# Patient Record
Sex: Female | Born: 1950 | Race: White | Hispanic: No | Marital: Married | Smoking: Former smoker
Health system: Southern US, Community
[De-identification: ages and names within clinical notes are randomized; demographics above are authoritative.]

## PROBLEM LIST (undated history)

## (undated) DIAGNOSIS — M1990 Unspecified osteoarthritis, unspecified site: Secondary | ICD-10-CM

## (undated) DIAGNOSIS — I10 Essential (primary) hypertension: Secondary | ICD-10-CM

## (undated) DIAGNOSIS — R51 Headache: Secondary | ICD-10-CM

## (undated) DIAGNOSIS — Z9889 Other specified postprocedural states: Secondary | ICD-10-CM

## (undated) DIAGNOSIS — K589 Irritable bowel syndrome without diarrhea: Secondary | ICD-10-CM

## (undated) DIAGNOSIS — K5909 Other constipation: Secondary | ICD-10-CM

## (undated) DIAGNOSIS — R112 Nausea with vomiting, unspecified: Secondary | ICD-10-CM

## (undated) HISTORY — DX: Other constipation: K59.09

## (undated) HISTORY — PX: TOTAL HIP ARTHROPLASTY: SHX124

## (undated) HISTORY — PX: COLONOSCOPY: SHX174

## (undated) HISTORY — DX: Essential (primary) hypertension: I10

## (undated) HISTORY — DX: Irritable bowel syndrome, unspecified: K58.9

## (undated) HISTORY — DX: Unspecified osteoarthritis, unspecified site: M19.90

## (undated) HISTORY — PX: OTHER SURGICAL HISTORY: SHX169

---

## 1984-01-01 HISTORY — PX: TUBAL LIGATION: SHX77

## 1984-01-01 HISTORY — PX: ECTOPIC PREGNANCY SURGERY: SHX613

## 2003-11-19 ENCOUNTER — Encounter: Admission: RE | Admit: 2003-11-19 | Discharge: 2003-11-19 | Payer: Self-pay | Admitting: Internal Medicine

## 2004-01-10 ENCOUNTER — Other Ambulatory Visit: Admission: RE | Admit: 2004-01-10 | Discharge: 2004-01-10 | Payer: Self-pay | Admitting: Obstetrics and Gynecology

## 2004-04-21 ENCOUNTER — Encounter: Admission: RE | Admit: 2004-04-21 | Discharge: 2004-04-21 | Payer: Self-pay | Admitting: Obstetrics and Gynecology

## 2004-05-17 ENCOUNTER — Encounter: Payer: Self-pay | Admitting: Internal Medicine

## 2005-01-29 ENCOUNTER — Encounter: Admission: RE | Admit: 2005-01-29 | Discharge: 2005-01-29 | Payer: Self-pay | Admitting: Obstetrics and Gynecology

## 2005-06-01 ENCOUNTER — Ambulatory Visit: Payer: Self-pay | Admitting: Internal Medicine

## 2005-06-11 ENCOUNTER — Ambulatory Visit: Payer: Self-pay | Admitting: Internal Medicine

## 2005-06-12 ENCOUNTER — Encounter: Admission: RE | Admit: 2005-06-12 | Discharge: 2005-06-12 | Payer: Self-pay | Admitting: Internal Medicine

## 2005-06-14 ENCOUNTER — Ambulatory Visit: Payer: Self-pay | Admitting: Internal Medicine

## 2005-06-15 ENCOUNTER — Encounter: Payer: Self-pay | Admitting: Internal Medicine

## 2005-06-29 ENCOUNTER — Ambulatory Visit: Payer: Self-pay | Admitting: Internal Medicine

## 2005-12-07 ENCOUNTER — Ambulatory Visit: Payer: Self-pay | Admitting: Internal Medicine

## 2005-12-17 ENCOUNTER — Ambulatory Visit: Payer: Self-pay | Admitting: Internal Medicine

## 2006-01-25 ENCOUNTER — Encounter: Admission: RE | Admit: 2006-01-25 | Discharge: 2006-01-25 | Payer: Self-pay | Admitting: Internal Medicine

## 2006-01-29 ENCOUNTER — Ambulatory Visit: Payer: Self-pay | Admitting: Internal Medicine

## 2006-02-05 ENCOUNTER — Encounter: Admission: RE | Admit: 2006-02-05 | Discharge: 2006-04-09 | Payer: Self-pay | Admitting: Internal Medicine

## 2006-02-07 ENCOUNTER — Encounter: Admission: RE | Admit: 2006-02-07 | Discharge: 2006-02-07 | Payer: Self-pay | Admitting: Obstetrics and Gynecology

## 2006-05-06 ENCOUNTER — Ambulatory Visit: Payer: Self-pay | Admitting: Internal Medicine

## 2006-08-08 ENCOUNTER — Encounter: Admission: RE | Admit: 2006-08-08 | Discharge: 2006-08-08 | Payer: Self-pay | Admitting: Orthopedic Surgery

## 2006-11-15 ENCOUNTER — Encounter: Admission: RE | Admit: 2006-11-15 | Discharge: 2006-11-15 | Payer: Self-pay | Admitting: Orthopedic Surgery

## 2006-12-03 ENCOUNTER — Ambulatory Visit: Payer: Self-pay | Admitting: Internal Medicine

## 2006-12-03 LAB — CONVERTED CEMR LAB
Free T4: 1.1 ng/dL (ref 0.9–1.8)
T3, Free: 2.6 pg/mL (ref 2.3–4.2)
TSH: 0.86 microintl units/mL (ref 0.35–5.50)

## 2006-12-12 ENCOUNTER — Ambulatory Visit: Payer: Self-pay | Admitting: Internal Medicine

## 2007-02-24 ENCOUNTER — Encounter: Admission: RE | Admit: 2007-02-24 | Discharge: 2007-02-24 | Payer: Self-pay | Admitting: Obstetrics and Gynecology

## 2007-03-21 ENCOUNTER — Encounter: Admission: RE | Admit: 2007-03-21 | Discharge: 2007-03-21 | Payer: Self-pay | Admitting: Orthopedic Surgery

## 2007-04-03 ENCOUNTER — Encounter: Admission: RE | Admit: 2007-04-03 | Discharge: 2007-04-03 | Payer: Self-pay | Admitting: Orthopedic Surgery

## 2007-05-23 ENCOUNTER — Ambulatory Visit: Payer: Self-pay | Admitting: Internal Medicine

## 2007-05-23 ENCOUNTER — Encounter: Payer: Self-pay | Admitting: Internal Medicine

## 2007-05-23 LAB — CONVERTED CEMR LAB
Cholesterol: 191 mg/dL (ref 0–200)
Glucose, Bld: 91 mg/dL (ref 70–99)
HDL: 51.7 mg/dL (ref >39.0)
Triglycerides: 70 mg/dL (ref 0–149)

## 2007-06-05 ENCOUNTER — Telehealth: Payer: Self-pay | Admitting: Internal Medicine

## 2007-06-18 ENCOUNTER — Ambulatory Visit: Payer: Self-pay | Admitting: Internal Medicine

## 2007-06-23 ENCOUNTER — Inpatient Hospital Stay (HOSPITAL_COMMUNITY): Admission: RE | Admit: 2007-06-23 | Discharge: 2007-06-26 | Payer: Self-pay | Admitting: Orthopedic Surgery

## 2007-07-08 ENCOUNTER — Telehealth (INDEPENDENT_AMBULATORY_CARE_PROVIDER_SITE_OTHER): Payer: Self-pay | Admitting: *Deleted

## 2007-10-16 ENCOUNTER — Ambulatory Visit: Payer: Self-pay | Admitting: Internal Medicine

## 2007-10-21 ENCOUNTER — Encounter (INDEPENDENT_AMBULATORY_CARE_PROVIDER_SITE_OTHER): Payer: Self-pay | Admitting: *Deleted

## 2007-10-22 ENCOUNTER — Ambulatory Visit: Payer: Self-pay | Admitting: Internal Medicine

## 2007-10-22 DIAGNOSIS — E559 Vitamin D deficiency, unspecified: Secondary | ICD-10-CM | POA: Insufficient documentation

## 2007-10-22 DIAGNOSIS — I10 Essential (primary) hypertension: Secondary | ICD-10-CM | POA: Insufficient documentation

## 2007-11-03 ENCOUNTER — Inpatient Hospital Stay (HOSPITAL_COMMUNITY): Admission: RE | Admit: 2007-11-03 | Discharge: 2007-11-06 | Payer: Self-pay | Admitting: Orthopedic Surgery

## 2007-11-12 IMAGING — CR DG PORTABLE PELVIS
1 series · 1 of 1 positions shown · non-contrast
Comparison: 06/23/2007

CLINICAL DATA: Post-op right hip. 
 PORTABLE PELVIS - 1 VIEW AT 7995 HOURS:

[view not recorded]
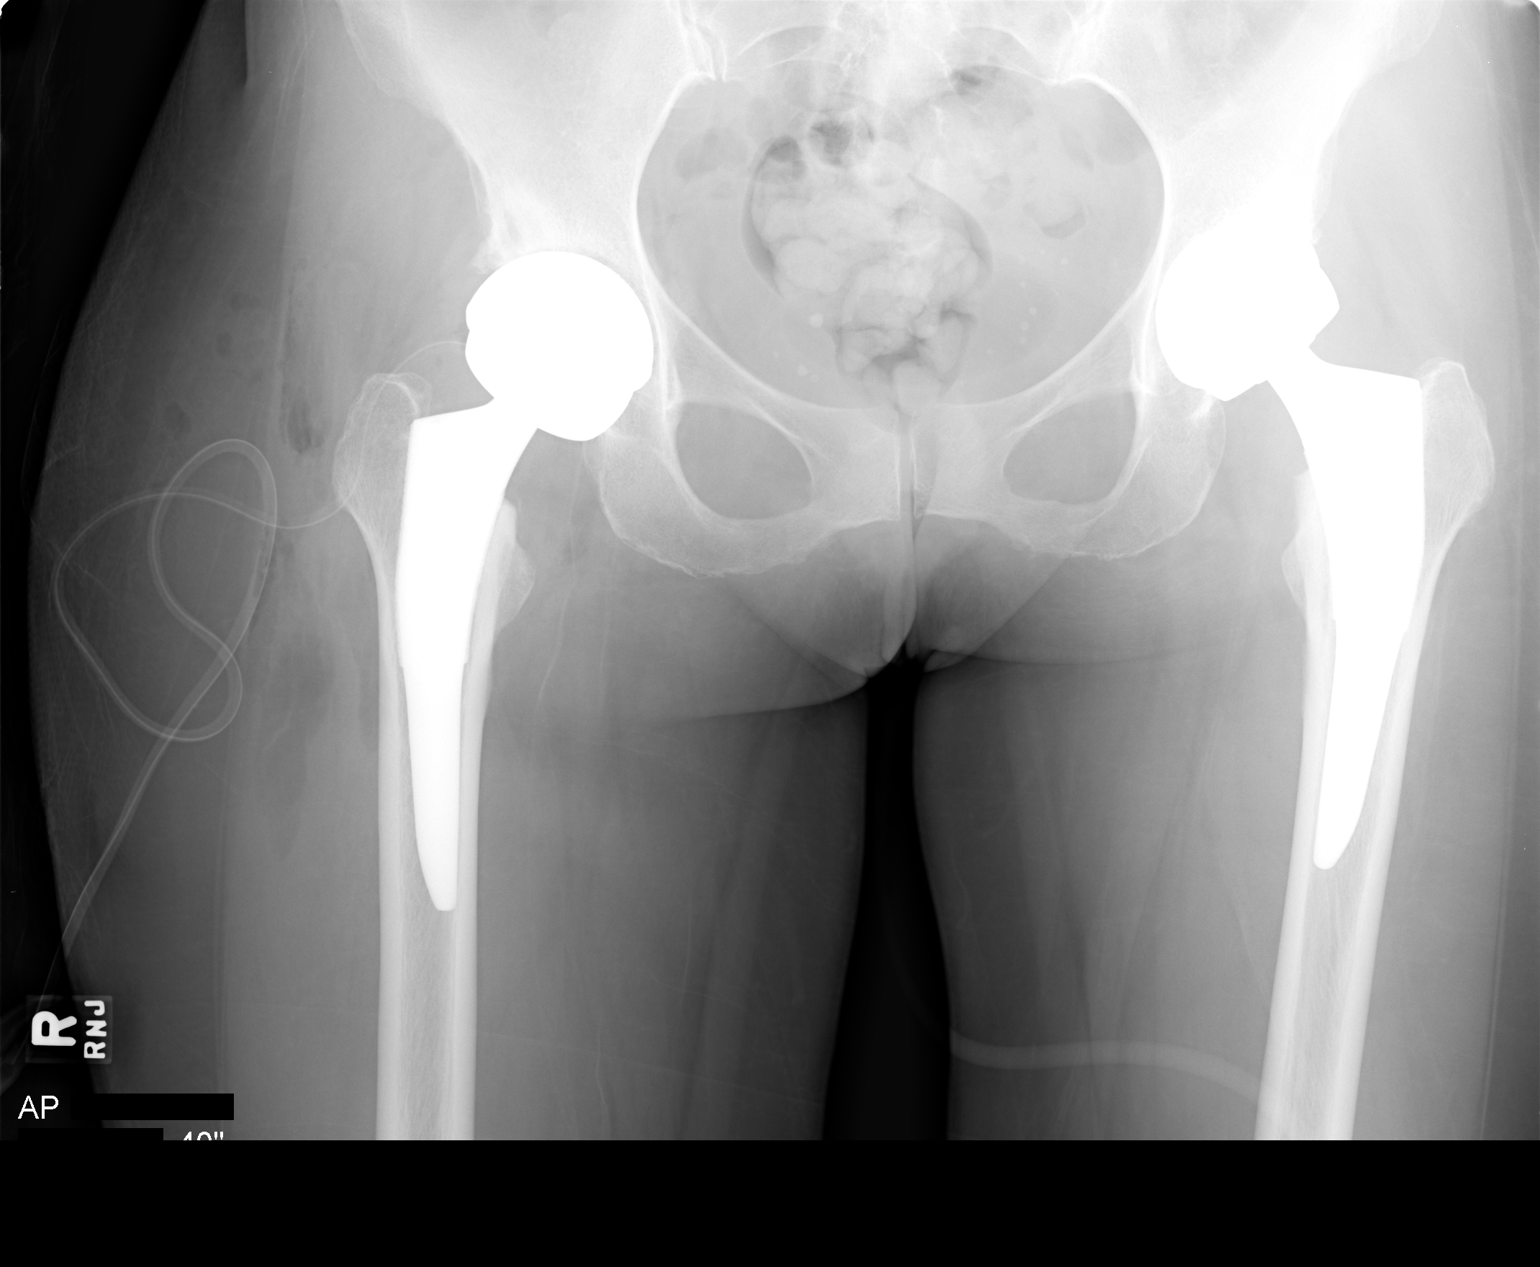

[1 of 1 positions shown; findings below may reference images not displayed]

FINDINGS: Patient status post right hip arthroplasty.  Right acetabular and femoral prosthetic components appear in satisfactory position and alignment on this single view. Radiopaque drain is noted.  Pre-existing left THR is noted.
IMPRESSION: Satisfactory appearance of right THR.

## 2008-02-27 ENCOUNTER — Encounter: Admission: RE | Admit: 2008-02-27 | Discharge: 2008-02-27 | Payer: Self-pay | Admitting: Obstetrics

## 2008-04-29 ENCOUNTER — Encounter (INDEPENDENT_AMBULATORY_CARE_PROVIDER_SITE_OTHER): Payer: Self-pay | Admitting: *Deleted

## 2008-04-29 ENCOUNTER — Ambulatory Visit: Payer: Self-pay | Admitting: Internal Medicine

## 2008-05-10 ENCOUNTER — Encounter (INDEPENDENT_AMBULATORY_CARE_PROVIDER_SITE_OTHER): Payer: Self-pay | Admitting: *Deleted

## 2008-05-20 ENCOUNTER — Encounter: Payer: Self-pay | Admitting: Internal Medicine

## 2008-08-10 ENCOUNTER — Ambulatory Visit: Payer: Self-pay | Admitting: Internal Medicine

## 2008-09-08 ENCOUNTER — Encounter (INDEPENDENT_AMBULATORY_CARE_PROVIDER_SITE_OTHER): Payer: Self-pay | Admitting: *Deleted

## 2008-11-19 ENCOUNTER — Ambulatory Visit: Payer: Self-pay | Admitting: Family Medicine

## 2008-11-19 ENCOUNTER — Encounter: Payer: Self-pay | Admitting: Internal Medicine

## 2008-11-29 ENCOUNTER — Encounter (INDEPENDENT_AMBULATORY_CARE_PROVIDER_SITE_OTHER): Payer: Self-pay | Admitting: *Deleted

## 2009-02-28 ENCOUNTER — Encounter: Admission: RE | Admit: 2009-02-28 | Discharge: 2009-02-28 | Payer: Self-pay | Admitting: Obstetrics

## 2009-05-03 ENCOUNTER — Ambulatory Visit: Payer: Self-pay | Admitting: Internal Medicine

## 2009-05-03 DIAGNOSIS — M159 Polyosteoarthritis, unspecified: Secondary | ICD-10-CM | POA: Insufficient documentation

## 2009-05-03 DIAGNOSIS — N959 Unspecified menopausal and perimenopausal disorder: Secondary | ICD-10-CM | POA: Insufficient documentation

## 2009-05-04 ENCOUNTER — Encounter (INDEPENDENT_AMBULATORY_CARE_PROVIDER_SITE_OTHER): Payer: Self-pay | Admitting: *Deleted

## 2009-05-06 ENCOUNTER — Encounter (INDEPENDENT_AMBULATORY_CARE_PROVIDER_SITE_OTHER): Payer: Self-pay | Admitting: *Deleted

## 2009-05-18 ENCOUNTER — Ambulatory Visit: Payer: Self-pay | Admitting: Internal Medicine

## 2009-05-18 ENCOUNTER — Encounter: Payer: Self-pay | Admitting: Internal Medicine

## 2009-05-31 ENCOUNTER — Encounter (INDEPENDENT_AMBULATORY_CARE_PROVIDER_SITE_OTHER): Payer: Self-pay | Admitting: *Deleted

## 2009-12-01 ENCOUNTER — Telehealth: Payer: Self-pay | Admitting: Internal Medicine

## 2009-12-05 ENCOUNTER — Encounter: Admission: RE | Admit: 2009-12-05 | Discharge: 2009-12-05 | Payer: Self-pay | Admitting: Orthopedic Surgery

## 2009-12-06 ENCOUNTER — Ambulatory Visit: Payer: Self-pay | Admitting: Internal Medicine

## 2009-12-06 LAB — CONVERTED CEMR LAB: Vit D, 25-Hydroxy: 51 ng/mL (ref 30–89)

## 2009-12-10 LAB — CONVERTED CEMR LAB
ALT: 17 units/L (ref 0–35)
Creatinine, Ser: 0.9 mg/dL (ref 0.4–1.2)
Total Protein: 7.1 g/dL (ref 6.0–8.3)

## 2009-12-12 ENCOUNTER — Encounter (INDEPENDENT_AMBULATORY_CARE_PROVIDER_SITE_OTHER): Payer: Self-pay | Admitting: *Deleted

## 2009-12-31 HISTORY — PX: CHOLECYSTECTOMY: SHX55

## 2010-02-07 ENCOUNTER — Ambulatory Visit (HOSPITAL_COMMUNITY): Admission: RE | Admit: 2010-02-07 | Discharge: 2010-02-07 | Payer: Self-pay | Admitting: Orthopedic Surgery

## 2010-03-14 ENCOUNTER — Encounter: Admission: RE | Admit: 2010-03-14 | Discharge: 2010-03-14 | Payer: Self-pay | Admitting: Obstetrics

## 2010-06-19 ENCOUNTER — Ambulatory Visit: Payer: Self-pay | Admitting: Internal Medicine

## 2010-06-23 LAB — CONVERTED CEMR LAB: Vit D, 25-Hydroxy: 52 ng/mL (ref 30–89)

## 2010-07-13 ENCOUNTER — Ambulatory Visit: Payer: Self-pay | Admitting: Internal Medicine

## 2010-07-13 DIAGNOSIS — T887XXA Unspecified adverse effect of drug or medicament, initial encounter: Secondary | ICD-10-CM

## 2010-07-13 DIAGNOSIS — M25559 Pain in unspecified hip: Secondary | ICD-10-CM

## 2010-07-13 DIAGNOSIS — I080 Rheumatic disorders of both mitral and aortic valves: Secondary | ICD-10-CM

## 2010-07-17 ENCOUNTER — Telehealth (INDEPENDENT_AMBULATORY_CARE_PROVIDER_SITE_OTHER): Payer: Self-pay | Admitting: *Deleted

## 2010-08-01 ENCOUNTER — Inpatient Hospital Stay (HOSPITAL_COMMUNITY): Admission: RE | Admit: 2010-08-01 | Discharge: 2010-08-03 | Payer: Self-pay | Admitting: Orthopedic Surgery

## 2010-08-10 IMAGING — CR DG PORTABLE PELVIS
1 series · 1 of 1 positions shown · non-contrast
Comparison: 11/03/2007.

CLINICAL DATA: Failed total left hip.

PORTABLE PELVIS

[series [date]]
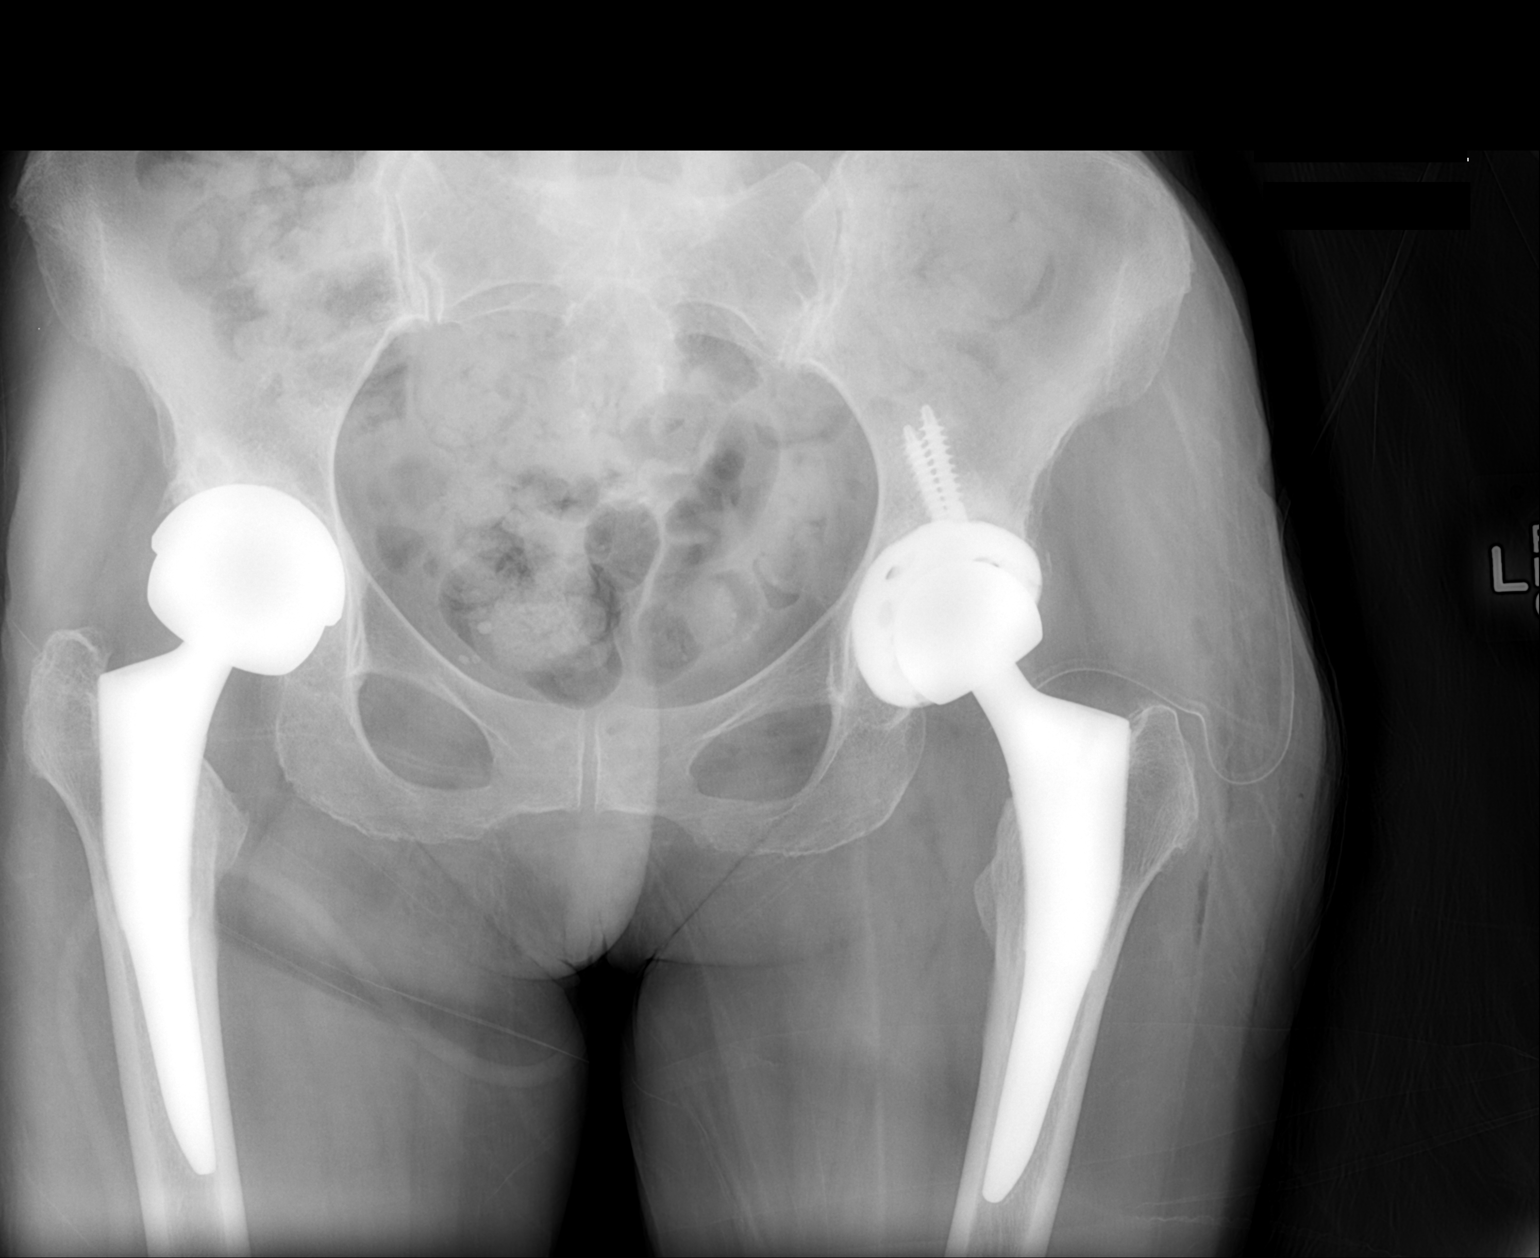

[1 of 1 positions shown; findings below may reference images not displayed]

FINDINGS: The left total hip arthroplasty appears well seated
without complicating features.
IMPRESSION: Well seated components of a total left hip arthroplasty without
complicating features.

## 2010-11-20 ENCOUNTER — Ambulatory Visit: Payer: Self-pay | Admitting: Internal Medicine

## 2010-11-20 DIAGNOSIS — R1013 Epigastric pain: Secondary | ICD-10-CM

## 2010-11-21 ENCOUNTER — Ambulatory Visit: Payer: Self-pay | Admitting: Internal Medicine

## 2010-11-21 ENCOUNTER — Telehealth: Payer: Self-pay | Admitting: Internal Medicine

## 2010-11-22 ENCOUNTER — Ambulatory Visit: Payer: Self-pay | Admitting: Internal Medicine

## 2010-11-22 ENCOUNTER — Encounter: Admission: RE | Admit: 2010-11-22 | Discharge: 2010-11-22 | Payer: Self-pay | Admitting: Internal Medicine

## 2010-11-22 DIAGNOSIS — K8020 Calculus of gallbladder without cholecystitis without obstruction: Secondary | ICD-10-CM | POA: Insufficient documentation

## 2010-11-22 DIAGNOSIS — R740 Nonspecific elevation of levels of transaminase and lactic acid dehydrogenase [LDH]: Secondary | ICD-10-CM

## 2010-11-22 DIAGNOSIS — D72819 Decreased white blood cell count, unspecified: Secondary | ICD-10-CM | POA: Insufficient documentation

## 2010-11-22 DIAGNOSIS — D7289 Other specified disorders of white blood cells: Secondary | ICD-10-CM

## 2010-11-22 LAB — CONVERTED CEMR LAB
AST: 167 units/L — ABNORMAL HIGH (ref 0–37)
Amylase: 106 units/L (ref 27–131)
Basophils Absolute: 0 10*3/uL (ref 0.0–0.1)
H Pylori IgG: NEGATIVE
HCT: 36.3 % (ref 36.0–46.0)
Lymphocytes Relative: 54.4 % — ABNORMAL HIGH (ref 12.0–46.0)
Lymphs Abs: 1.4 10*3/uL (ref 0.7–4.0)
Monocytes Relative: 11.4 % (ref 3.0–12.0)
Platelets: 223 10*3/uL (ref 150.0–400.0)
RDW: 15.7 % — ABNORMAL HIGH (ref 11.5–14.6)
Total Bilirubin: 0.7 mg/dL (ref 0.3–1.2)

## 2010-11-25 ENCOUNTER — Telehealth: Payer: Self-pay | Admitting: Internal Medicine

## 2010-11-27 ENCOUNTER — Ambulatory Visit: Payer: Self-pay | Admitting: Internal Medicine

## 2010-11-27 LAB — CONVERTED CEMR LAB
AST: 95 units/L — ABNORMAL HIGH (ref 0–37)
Bilirubin, Direct: 0.2 mg/dL (ref 0.0–0.3)
HCV Ab: NEGATIVE
Hep B C IgM: NEGATIVE
OCCULT 2: NEGATIVE
Total Bilirubin: 0.7 mg/dL (ref 0.3–1.2)

## 2010-11-28 ENCOUNTER — Encounter (INDEPENDENT_AMBULATORY_CARE_PROVIDER_SITE_OTHER): Payer: Self-pay | Admitting: *Deleted

## 2010-11-28 LAB — CONVERTED CEMR LAB
ALT: 107 units/L — ABNORMAL HIGH (ref 0–35)
AST: 31 units/L (ref 0–37)
Basophils Relative: 0.9 % (ref 0.0–3.0)
Bilirubin, Direct: 0.1 mg/dL (ref 0.0–0.3)
Eosinophils Relative: 2.3 % (ref 0.0–5.0)
HCT: 37.9 % (ref 36.0–46.0)
MCV: 91.5 fL (ref 78.0–100.0)
Monocytes Absolute: 0.3 10*3/uL (ref 0.1–1.0)
Monocytes Relative: 9.2 % (ref 3.0–12.0)
Neutrophils Relative %: 55.3 % (ref 43.0–77.0)
Platelets: 233 10*3/uL (ref 150.0–400.0)
RBC: 4.14 M/uL (ref 3.87–5.11)
Total Bilirubin: 0.4 mg/dL (ref 0.3–1.2)
Total Protein: 7.3 g/dL (ref 6.0–8.3)
WBC: 3.7 10*3/uL — ABNORMAL LOW (ref 4.5–10.5)

## 2010-12-08 ENCOUNTER — Ambulatory Visit: Payer: Self-pay | Admitting: Internal Medicine

## 2010-12-12 ENCOUNTER — Ambulatory Visit (HOSPITAL_COMMUNITY)
Admission: RE | Admit: 2010-12-12 | Discharge: 2010-12-12 | Payer: Self-pay | Source: Home / Self Care | Attending: Surgery | Admitting: Surgery

## 2010-12-15 ENCOUNTER — Telehealth (INDEPENDENT_AMBULATORY_CARE_PROVIDER_SITE_OTHER): Payer: Self-pay | Admitting: *Deleted

## 2010-12-20 LAB — CONVERTED CEMR LAB
Bilirubin, Direct: 0.1 mg/dL (ref 0.0–0.3)
Total Bilirubin: 0.7 mg/dL (ref 0.3–1.2)

## 2011-01-05 ENCOUNTER — Encounter: Payer: Self-pay | Admitting: Internal Medicine

## 2011-01-28 LAB — CONVERTED CEMR LAB
Albumin: 4.1 g/dL (ref 3.5–5.2)
Basophils Relative: 1 % (ref 0.0–3.0)
CO2: 31 meq/L (ref 19–32)
Chloride: 108 meq/L (ref 96–112)
Cholesterol, target level: 200 mg/dL
Creatinine, Ser: 0.8 mg/dL (ref 0.4–1.2)
Eosinophils Absolute: 0.2 10*3/uL (ref 0.0–0.7)
HDL goal, serum: 40 mg/dL
Hemoglobin: 12.6 g/dL (ref 12.0–15.0)
LDL Goal: 130 mg/dL
MCHC: 34.8 g/dL (ref 30.0–36.0)
MCV: 91.5 fL (ref 78.0–100.0)
Monocytes Absolute: 0.4 10*3/uL (ref 0.1–1.0)
Neutro Abs: 2.8 10*3/uL (ref 1.4–7.7)
RBC: 3.97 M/uL (ref 3.87–5.11)
Sodium: 144 meq/L (ref 135–145)
TSH: 1.64 microintl units/mL (ref 0.35–5.50)
Total CHOL/HDL Ratio: 4
Total Protein: 7.3 g/dL (ref 6.0–8.3)
Triglycerides: 108 mg/dL (ref 0.0–149.0)
Vit D, 25-Hydroxy: 53 ng/mL (ref 30–89)

## 2011-01-30 NOTE — Progress Notes (Signed)
Summary: Test results  Phone Note Call from Patient Call back at [phone]   Summary of Call: Patient called for test results. Please advise. Initial call taken by: Lucious Groves CMA,  November 21, 2010 4:39 PM  Follow-up for Phone Call        WBC very low & liver function tests are very high. [place] of liver 11/23 . Fasting liver panel in am with acute hepatitis picture Follow-up by: Marga Melnick MD,  November 21, 2010 5:02 PM  Additional Follow-up for Phone Call Additional follow up Details #1::        Patient notified and will come in the AM for labs. She was advised to have u/s before leavign for vacation. She is aware to check on the status of referral with [facility] when she is here tomorrow. Additional Follow-up by: Lucious Groves CMA,  November 21, 2010 5:13 PM

## 2011-01-30 NOTE — Assessment & Plan Note (Signed)
Summary: MEDICAL CLEARANCE FOR SURGERY//KN   Vital Signs:  Patient profile:   60 year old female Height:      64.5 inches Weight:      118.4 pounds BMI:     20.08 Temp:     98.3 degrees F oral Pulse rate:   72 / minute Resp:     12 per minute BP sitting:   110 / 70  (left arm) Cuff size:   regular  Vitals Entered By: Shonna Chock CMA (July 13, 2010 7:57 AM) CC: Surgical clearance: Left Hip Replacement (Revision), Pre-op Evaluation Comments REVIEWED MED LIST, PATIENT AGREED DOSE AND INSTRUCTION CORRECT    CC:  Surgical clearance: Left Hip Replacement (Revision) and Pre-op Evaluation.  History of Present Illness: Kayla Sanford is here for pre op medical clearance. Ceramic hip implant to replace L Dupuy prosthesis planned because of popping & pain   & increased  serum cobalt/ chromium levels. Thus far the R hip prosthesis has not ben associated with these issues. BMD in 04/2009 revealed low normal @ all tested sites(hips excluded). Vitamin D level 52 recently.                                                                                         The patient denies respiratory symptoms  or GI symptoms. The patient  is on a Beta blocker  for hypertension  & palpitations. She has has mild MR & practices SBE pre operatively.The patient denies lightheadedness, urinary frequency, headaches, rash, and fatigue.  The patient denies the following associated symptoms: chest pain, chest pressure, exercise intolerance, dyspnea, palpitations, syncope, leg or  pedal edema.  Compliance with medications (by patient report) has been near 100%.  The patient reports that dietary compliance has been excellent ( heart healthy, low carb).  The patient reports no exercise due to the L hip pain.  Adjunctive measures currently used by the patient include salt restriction.    Allergies: 1)  ! Pcn 2)  ! 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 Resp:  Denies cough, shortness of breath, sputum productive, and wheezing. GI:  Denies abdominal pain, bloody stools, dark tarry stools, and indigestion.  Physical Exam  General:  well-nourished,in no acute distress; alert,appropriate and cooperative throughout examination Neck:  No deformities, masses, or tenderness noted. Thyroid contour slightly irregular w/o discrete nodules Lungs:  Normal respiratory effort, chest expands symmetrically. Lungs are clear to auscultation, no crackles or wheezes. Heart:  Normal rate and regular rhythm. S1 and  S2 normal without gallop, murmur,  rub .Mitral click  @ apex ; clinically no regurgitation. Abdomen:  Bowel sounds positive,abdomen soft and non-tender without masses, organomegaly or hernias noted.Aorta palpable w/o AAA Msk:  No deformity or scoliosis noted of thoracic or lumbar spine.   Pulses:  R and L carotid,radial,dorsalis pedis and posterior tibial pulses are full and equal bilaterally Extremities:  No clubbing, cyanosis, edema, or deformity noted with normal full range of motion of all joints.  Mild pain L medial thigh with hip ROM  Neurologic:  alert & oriented X3, strength normal in all extremities, and DTRs symmetrical and normal.   Skin:  Intact without suspicious lesions or rashes Psych:  memory intact for  recent and remote, normally interactive, and good eye contact.     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 1 by mouth two times a day prn  Problem # 2:  UNS ADVRS EFF UNS RX MEDICINAL&BIOLOGICAL SBSTNC (ICD-995.20) elevated chromium & cobalt levels  Problem # 3:  HYPERTENSION, ESSENTIAL NOS (ICD-401.9)  controlled Her updated medication list for this problem includes:    Toprol Xl 50 Mg Tb24 (Metoprolol succinate) .Marland Kitchen... 1 by mouth once daily  Orders: EKG w/ Interpretation (93000)  Problem # 4:  MITRAL REGURGITATION, 0 (MILD) (ICD-396.3)  PMH of,normal  ECHO 2000; clinically not significant, probable  benign MVP Her updated medication list for this problem includes:    Toprol Xl 50 Mg Tb24 (Metoprolol succinate) .Marland Kitchen... 1 by mouth once daily  Orders: EKG w/ Interpretation (93000)  Problem # 5:  VITAMIN D DEFICIENCY (ICD-268.9) corrected  Complete Medication List: 1)  Toprol Xl 50 Mg Tb24 (Metoprolol succinate) .Marland Kitchen.. 1 by mouth once daily 2)  Flax Seed Oil 1000 Mg Caps (Flaxseed (linseed)) 3)  Calcium  .... 500mg  bid 4)  Vitamin D 1000 Unit Tabs (Cholecalciferol) .Marland Kitchen.. 1 by mouth once daily 5)  Diclofenac Sodium 75 Mg Tbec (Diclofenac sodium) .Marland Kitchen.. 1 by mouth two times a day prn 6)  Evening Primrose Oil 1000 Mg Caps (Evening primrose oil) .Marland Kitchen.. 1 by mouth once daily 7)  Osteo Bi-flex Adv Triple St Tabs (Misc natural products) .Marland Kitchen.. 1 by mouth once daily 8)  Biotin 300 Mcg Tabs (Biotin) .Marland Kitchen.. 1 by mouth once daily  Patient Instructions: 1)  Share data with all MDs seen

## 2011-01-30 NOTE — Progress Notes (Signed)
----   Converted from flag ---- ---- 11/22/2010 5:18 PM, Marga Melnick MD wrote:  cell :[phone]; [facility] ------------------------------  I left message concerning repeat labs on both this # & home #

## 2011-01-30 NOTE — Assessment & Plan Note (Signed)
Summary: stomach ulcer/kn   Vital Signs:  Patient profile:   60 year old female Weight:      118.0 pounds BMI:     20.01 Temp:     98.4 degrees F oral Pulse rate:   60 / minute Resp:     16 per minute BP sitting:   104 / 70  (left arm) Cuff size:   regular  Vitals Entered By: Shonna Chock CMA (November 20, 2010 1:38 PM) CC: ? Stomach Ulcer, first flare up of pain about 2 weeks ago and then again this past Thursday, Abdominal pain   CC:  ? Stomach Ulcer, first flare up of pain about 2 weeks ago and then again this past Thursday, and Abdominal pain.  History of Present Illness: Abdominal Pain      This is a 60 year old woman who presents with Abdominal pain, initial episode was 2 & 1/2 weeks ago post prandially as cramping pain . Similar episode 11/ 17 after ice cream. Both of those lasted all night .  The patient denies nausea, vomiting, diarrhea, constipation, melena, hematochezia, anorexia, and hematemesis.  The location of the pain is epigastric.  The pain is  now described as constant , "gaseous" and dull since 11/18 am.  The patient denies the following symptoms: fever, weight loss, dysuria, chest pain, jaundice, dark urine, and vaginal bleeding.  The pain was worse with food only with 1st two episodes.  The pain is minimally  better with antacids.  She takes NSAIDS or ASA daily for MS issues. Her mother had colon polyps & her father had colon cancer.Colonoscopies have been negative.  Current Medications (verified): 1)  Toprol Xl 50 Mg Tb24 (Metoprolol Succinate) .Marland Kitchen.. 1 By Mouth Once Daily 2)  Flax Seed Oil 1000 Mg  Caps (Flaxseed (Linseed)) .Marland Kitchen.. 1 By Mouth Once Daily 3)  Calcium .... 500mg  Bid 4)  Vitamin D 1000 Unit Tabs (Cholecalciferol) .Marland Kitchen.. 1 By Mouth Once Daily 5)  Diclofenac Sodium 75 Mg  Tbec (Diclofenac Sodium) .Marland Kitchen.. 1 By Mouth Two Times A Day Prn 6)  Evening Primrose Oil 1000 Mg  Caps (Evening Primrose Oil) .Marland Kitchen.. 1 By Mouth Once Daily 7)  Osteo Bi-Flex Adv Triple St   Tabs  (Misc Natural Products) .Marland Kitchen.. 1 By Mouth Once Daily 8)  Biotin 300 Mcg Tabs (Biotin) .Marland Kitchen.. 1 By Mouth Once Daily  Allergies: 1)  ! Pcn 2)  ! * Msg  Past History:  Past Medical History: Hypertension Mild Mitral Regurgitation, PMH of, clinical mitral click ; OA Hyperlipidemia :NMR 2005: LDL 118(1173/101); LDL goal = < 130 Vitamin D deficiency  Review of Systems ENT:  Denies difficulty swallowing and hoarseness. GU:  Denies discharge and hematuria.  Physical Exam  General:  hin ,in no acute distress; alert,appropriate and cooperative throughout examination Eyes:  No corneal or conjunctival inflammation noted.No icterus Mouth:  Oral mucosa and oropharynx without lesions or exudates.  Teeth in good repair. No pharyngeal erythema.   Lungs:  Normal respiratory effort, chest expands symmetrically. Lungs are clear to auscultation, no crackles or wheezes. Heart:  regular rhythm, no murmur, no gallop, no rub, no JVD, and bradycardia.   Abdomen:  Bowel sounds positive,abdomen soft and non-tender without masses, organomegaly or hernias noted. Aorta palpable w/o AAA Pulses:  R and L carotid,radialdorsalis pedis and posterior tibial pulses are full and equal bilaterally Extremities:  No clubbing, cyanosis, edema Neurologic:  alert & oriented X3.   Skin:  Intact without suspicious lesions or rashes. No  jaundice Cervical Nodes:  No lymphadenopathy noted Axillary Nodes:  No palpable lymphadenopathy Psych:  memory intact for recent and remote, normally interactive, and good eye contact.     Impression & Recommendations:  Problem # 1:  ABDOMINAL PAIN, EPIGASTRIC (ICD-789.06)  Orders: Venipuncture (01601) TLB-CBC Platelet - w/Differential (85025-CBCD) TLB-Hepatic/Liver Function Pnl (80076-HEPATIC) TLB-Amylase (82150-AMYL) TLB-Lipase (83690-LIPASE) TLB-H. Pylori Abs(Helicobacter Pylori) (86677-HELICO)  Complete Medication List: 1)  Toprol Xl 50 Mg Tb24 (Metoprolol succinate) .Marland Kitchen.. 1 by mouth  once daily 2)  Flax Seed Oil 1000 Mg Caps (Flaxseed (linseed)) .Marland Kitchen.. 1 by mouth once daily 3)  Calcium  .... 500mg  bid 4)  Vitamin D 1000 Unit Tabs (Cholecalciferol) .Marland Kitchen.. 1 by mouth once daily 5)  Diclofenac Sodium 75 Mg Tbec (Diclofenac sodium) .Marland Kitchen.. 1 by mouth two times a day prn 6)  Evening Primrose Oil 1000 Mg Caps (Evening primrose oil) .Marland Kitchen.. 1 by mouth once daily 7)  Osteo Bi-flex Adv Triple St Tabs (Misc natural products) .Marland Kitchen.. 1 by mouth once daily 8)  Biotin 300 Mcg Tabs (Biotin) .Marland Kitchen.. 1 by mouth once daily 9)  Omeprazole 20 Mg Cpdr (Omeprazole) .Marland Kitchen.. 1 two times a day pre meals 10)  Tramadol Hcl 50 Mg Tabs (Tramadol hcl) .Marland Kitchen.. 1 every 6 hrs as needed for pain  Patient Instructions: 1)  Avoid triggers for acid production as discussed. 2)  Avoid foods high in acid (tomatoes, citrus juices, spicy foods). Avoid eating within two hours of lying down or before exercising. Do not over eat; try smaller more frequent meals. Elevate head of bed twelve inches when sleeping. Complete stool cards. Prescriptions: TRAMADOL HCL 50 MG TABS (TRAMADOL HCL) 1 every 6 hrs as needed for pain  #30 x 0   Entered and Authorized by:   Marga Melnick MD   Signed by:   Marga Melnick MD on 11/20/2010   Method used:   Faxed to ...       [facility] # [address] (retail)       [address]       [place], [place]  [address]       Ph: [phone]       Fax: [phone]   RxID:   [id number] OMEPRAZOLE 20 MG CPDR (OMEPRAZOLE) 1 two times a day pre meals  #60 x 0   Entered and Authorized by:   Marga Melnick MD   Signed by:   Marga Melnick MD on 11/20/2010   Method used:   Faxed to ...       [facility] # [address] (retail)       [address]       [place], [place]  [address]       Ph: [phone]       Fax: [phone]   RxID:   [id number]    Orders Added: 1)  Est. Patient Level IV [58527] 2)  Venipuncture [78242] 3)  TLB-CBC Platelet - w/Differential  [85025-CBCD] 4)  TLB-Hepatic/Liver Function Pnl [80076-HEPATIC] 5)  TLB-Amylase [82150-AMYL] 6)  TLB-Lipase [83690-LIPASE] 7)  TLB-H. Pylori Abs(Helicobacter Pylori) [86677-HELICO]  Appended Document: stomach ulcer/kn

## 2011-01-30 NOTE — Assessment & Plan Note (Signed)
Summary: ADD ON PER MD--DISCUSS ULTRASOUND/KB   Vital Signs:  Patient profile:   60 year old female Height:      64.5 inches (163.83 cm) Weight:      117.13 pounds (53.24 kg) BMI:     19.87 Temp:     98.2 degrees F (36.78 degrees C) oral Resp:     14 per minute BP sitting:   124 / 80  (left arm) Cuff size:   regular  Vitals Entered By: Lucious Groves CMA (November 22, 2010 4:40 PM) CC: OV per MD to discuss ultrasound./kb   CC:  OV per MD to discuss ultrasound./kb.  History of Present Illness: Labs & imaging reviewed ; low white count with Lymphocytosis & elevated non obstructive liver function pattern suggests possible hepatitis. US  shows small gallstones w/o obstructive pattern.  Current Medications (verified): 1)  Toprol Xl 50 Mg Tb24 (Metoprolol Succinate) .Marland Kitchen.. 1 By Mouth Once Daily 2)  Flax Seed Oil 1000 Mg  Caps (Flaxseed (Linseed)) .Marland Kitchen.. 1 By Mouth Once Daily 3)  Calcium .... 500mg  Bid 4)  Vitamin D 1000 Unit Tabs (Cholecalciferol) .Marland Kitchen.. 1 By Mouth Once Daily 5)  Diclofenac Sodium 75 Mg  Tbec (Diclofenac Sodium) .Marland Kitchen.. 1 By Mouth Two Times A Day Prn 6)  Evening Primrose Oil 1000 Mg  Caps (Evening Primrose Oil) .Marland Kitchen.. 1 By Mouth Once Daily 7)  Osteo Bi-Flex Adv Triple St   Tabs (Misc Natural Products) .Marland Kitchen.. 1 By Mouth Once Daily 8)  Biotin 300 Mcg Tabs (Biotin) .Marland Kitchen.. 1 By Mouth Once Daily 9)  Omeprazole 20 Mg Cpdr (Omeprazole) .Marland Kitchen.. 1 Two Times A Day Pre Meals 10)  Tramadol Hcl 50 Mg Tabs (Tramadol Hcl) .Marland Kitchen.. 1 Every 6 Hrs As Needed For Pain  Allergies (verified): 1)  ! Pcn 2)  ! * Msg  Review of Systems General:  Denies chills, fever, and sweats. GI:  Denies abdominal pain, bloody stools, dark tarry stools, gas, indigestion, nausea, and vomiting. GU:  No coke colored urine.  Physical Exam  General:  Thin but in no acute distress; alert,appropriate and cooperative throughout examination Eyes:  No corneal or conjunctival inflammation noted.No icterus Mouth:  Oral mucosa and  oropharynx without lesions or exudates.  Teeth in good repair. No pharyngeal erythema.   Lungs:  Normal respiratory effort, chest expands symmetrically. Lungs are clear to auscultation, no crackles or wheezes. Heart:  Normal rate and regular rhythm. S1 and S2 normal without gallop, murmur, click, rub.S4 Abdomen:  Bowel sounds positive,abdomen soft and non-tender without masses, organomegaly or hernias noted. Aorta papable ( no AAA on [place]). Tenderness to percussion over GB Skin:  Intact without suspicious lesions or rashes. No jaundice Psych:  memory intact for recent and remote, normally interactive, and good eye contact.     Impression & Recommendations:  Problem # 1:  ABDOMINAL PAIN, EPIGASTRIC (ICD-789.06) improved with PPI  Problem # 2:  GALLSTONES (ICD-574.20) ? passage of small stones X 2  as cause of #1  Problem # 3:  NONSPEC ELEVATION OF LEVELS OF TRANSAMINASE/LDH (ICD-790.4) probably due to # 2 Orders: T- * Misc. Laboratory test [phone])  Problem # 4:  LEUKOCYTOPENIA UNSPECIFIED (ICD-288.50) ? incidental , ? unrelated . 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 1 by mouth once daily 2)  Flax Seed Oil 1000 Mg Caps (Flaxseed (linseed)) .Marland Kitchen.. 1 by mouth once daily 3)  Calcium  .... 500mg  bid 4)  Vitamin D 1000 Unit Tabs (Cholecalciferol) .Marland Kitchen.. 1 by mouth once daily 5)  Diclofenac Sodium 75 Mg Tbec (Diclofenac sodium) .Marland Kitchen.. 1 by mouth two times a day prn 6)  Evening Primrose Oil 1000 Mg Caps (Evening primrose oil) .Marland Kitchen.. 1 by mouth once daily 7)  Osteo Bi-flex Adv Triple St Tabs (Misc natural products) .Marland Kitchen.. 1 by mouth once daily 8)  Biotin 300 Mcg Tabs (Biotin) .Marland Kitchen.. 1 by mouth once daily 9)  Omeprazole 20 Mg Cpdr (Omeprazole) .Marland Kitchen.. 1 two times a day pre meals 10)  Tramadol Hcl 50 Mg Tabs (Tramadol hcl) .Marland Kitchen.. 1 every 6 hrs as needed for pain  Patient Instructions: 1)  Avoid  milk, dairy & grease.AVOID taking more than 4000mg   in a 24 hour period of Tylenol  (can cause liver damage in higher doses). Repeat  fasting CBC & DIF and liver panel 11/28. To ER for increasing pain  or high fever.Take records with you.   Orders Added: 1)  T- * Misc. Laboratory test [99999] 2)  Est. Patient Level III [14782]

## 2011-01-30 NOTE — Progress Notes (Signed)
Summary: surgery clearance  Phone Note From Other Clinic Call back at [phone] ph [phone]   Caller: karen (olin [facility]) Summary of Call: medical clearance needs to be faxed over for revision on left total hip replacement on 8-2-11pls faxed to [phone] form faxed over on  june 20 and on  july 14 and it has not been received as of yet pls call or faxed form to let them know the status of clearance...........Marland KitchenFelecia Deloach CMA  July 17, 2010 3:54 PM   Follow-up for Phone Call        Dr.Hopper please advise, do you have this form Follow-up by: Shonna Chock CMA,  July 17, 2010 4:40 PM  Additional Follow-up for Phone Call Additional follow up Details #1::        She is medically cleared for surgery.FAX this statement with her preop evaluation & copy of EKG. Additional Follow-up by: Marga Melnick MD,  July 18, 2010 8:05 AM    Additional Follow-up for Phone Call Additional follow up Details #2::    information faxed to [facility] ....Marland KitchenMarland KitchenDoristine Devoid CMA  July 18, 2010 11:30 AM

## 2011-01-30 NOTE — Letter (Signed)
Summary: Results Follow up Letter  [facility] [place]  [address] [place], [place] [address]   Phone: [phone]  Fax: [phone]    11/28/2010 MRN: [id number]  RANAE CASEBIER [address] [place], [place]  [address]  Dear Ms. Jillyn Ledger,  The following are the results of your recent test(s):  Test         Result    Pap Smear:        Normal _____  Not Normal _____ Comments: ______________________________________________________ Cholesterol: LDL(Bad cholesterol):         Your goal is less than:         HDL (Good cholesterol):       Your goal is more than: Comments:  ______________________________________________________ Mammogram:        Normal _____  Not Normal _____ Comments:  ___________________________________________________________________ Hemoccult:        Normal __X___  Not normal _______ Comments:    _____________________________________________________________________ Other Tests:    We routinely do not discuss normal results over the telephone.  If you desire a copy of the results, or you have any questions about this information we can discuss them at your next office visit.   Sincerely,

## 2011-01-30 NOTE — Letter (Signed)
Summary: Surgical Clearance/[facility]  Surgical Clearance/[facility]   Imported By: Lanelle Bal 06/30/2010 15:26:12  _____________________________________________________________________  External Attachment:    Type:   Image     Comment:   External Document

## 2011-02-01 NOTE — Progress Notes (Signed)
Summary: does patient need lab--called, t/t spouse  Phone Note Call from Patient Call back at Work Phone [phone] Call back at [phone] Message from:  Patient on December 15, 2010 9:35 AM  Caller: Patient Summary of Call: patient is scheduled for lab - liver panel - [phone] - she wants to know if she still needs to have lab - she had gall bladder surgery  [id number] .Marland KitchenOkey Regal [place]  December 15, 2010 9:38 AM   Follow-up for Phone Call        11/28 Note says to repeat in 10-14 days, she she is actually past due for the labs. Lucious Groves CMA  December 15, 2010 10:49 AM   Additional Follow-up for Phone Call Additional follow up Details #1::        spoke to spouse--said patient still needed to come in for repeat fasting labs on tuesday, 12/19/2010 at 8:40am---he said he will tell her (they were at [employer]) Additional Follow-up by: Jerolyn Shin,  December 15, 2010 11:35 AM

## 2011-02-01 NOTE — Letter (Signed)
Summary: [facility]   Imported By: Lanelle Bal 01/26/2011 14:20:05  _____________________________________________________________________  External Attachment:    Type:   Image     Comment:   External Document

## 2011-02-07 ENCOUNTER — Other Ambulatory Visit: Payer: Self-pay | Admitting: Obstetrics

## 2011-02-07 DIAGNOSIS — Z1231 Encounter for screening mammogram for malignant neoplasm of breast: Secondary | ICD-10-CM

## 2011-03-13 LAB — DIFFERENTIAL
Basophils Absolute: 0.1 K/uL (ref 0.0–0.1)
Basophils Relative: 1 % (ref 0–1)
Eosinophils Absolute: 0.1 K/uL (ref 0.0–0.7)
Eosinophils Relative: 2 % (ref 0–5)
Lymphocytes Relative: 32 % (ref 12–46)
Lymphs Abs: 1.4 K/uL (ref 0.7–4.0)
Monocytes Absolute: 0.4 K/uL (ref 0.1–1.0)
Monocytes Relative: 9 % (ref 3–12)
Neutro Abs: 2.5 K/uL (ref 1.7–7.7)
Neutrophils Relative %: 57 % (ref 43–77)

## 2011-03-13 LAB — CBC
Hemoglobin: 12.5 g/dL (ref 12.0–15.0)
MCH: 29.8 pg (ref 26.0–34.0)
MCV: 91.6 fL (ref 78.0–100.0)
RBC: 4.19 MIL/uL (ref 3.87–5.11)
WBC: 4.4 10*3/uL (ref 4.0–10.5)

## 2011-03-13 LAB — COMPREHENSIVE METABOLIC PANEL WITH GFR
ALT: 21 U/L (ref 0–35)
AST: 22 U/L (ref 0–37)
Albumin: 4.3 g/dL (ref 3.5–5.2)
Alkaline Phosphatase: 83 U/L (ref 39–117)
BUN: 20 mg/dL (ref 6–23)
CO2: 30 meq/L (ref 19–32)
Calcium: 9.7 mg/dL (ref 8.4–10.5)
Chloride: 104 meq/L (ref 96–112)
Creatinine, Ser: 0.85 mg/dL (ref 0.4–1.2)
GFR calc non Af Amer: >60 mL/min
Glucose, Bld: 95 mg/dL (ref 70–99)
Potassium: 4.7 meq/L (ref 3.5–5.1)
Sodium: 142 meq/L (ref 135–145)
Total Bilirubin: 0.8 mg/dL (ref 0.3–1.2)
Total Protein: 7.4 g/dL (ref 6.0–8.3)

## 2011-03-13 LAB — SURGICAL PCR SCREEN
MRSA, PCR: NEGATIVE
Staphylococcus aureus: NEGATIVE

## 2011-03-16 ENCOUNTER — Ambulatory Visit
Admission: RE | Admit: 2011-03-16 | Discharge: 2011-03-16 | Disposition: A | Discharge status: Home / Self Care | Payer: BC Managed Care – PPO | Source: Ambulatory Visit | Attending: Obstetrics | Admitting: Obstetrics

## 2011-03-16 DIAGNOSIS — Z1231 Encounter for screening mammogram for malignant neoplasm of breast: Secondary | ICD-10-CM

## 2011-03-16 LAB — CBC
HCT: 29.1 % — ABNORMAL LOW (ref 36.0–46.0)
HCT: 32 % — ABNORMAL LOW (ref 36.0–46.0)
Hemoglobin: 9.9 g/dL — ABNORMAL LOW (ref 12.0–15.0)
MCHC: 34.2 g/dL (ref 30.0–36.0)
MCV: 88.4 fL (ref 78.0–100.0)
MCV: 88.8 fL (ref 78.0–100.0)
RBC: 3.29 MIL/uL — ABNORMAL LOW (ref 3.87–5.11)
RDW: 14.3 % (ref 11.5–15.5)
WBC: 8.2 10*3/uL (ref 4.0–10.5)
WBC: 9.5 10*3/uL (ref 4.0–10.5)

## 2011-03-16 LAB — BASIC METABOLIC PANEL
BUN: 11 mg/dL (ref 6–23)
CO2: 30 mEq/L (ref 19–32)
Chloride: 107 mEq/L (ref 96–112)
Chloride: 109 mEq/L (ref 96–112)
GFR calc Af Amer: >60 mL/min (ref >60)
Glucose, Bld: 109 mg/dL — ABNORMAL HIGH (ref 70–99)
Potassium: 4 mEq/L (ref 3.5–5.1)
Potassium: 4.2 mEq/L (ref 3.5–5.1)

## 2011-03-16 LAB — BODY FLUID CULTURE

## 2011-03-16 LAB — GRAM STAIN

## 2011-03-16 LAB — ANAEROBIC CULTURE

## 2011-03-16 LAB — TYPE AND SCREEN: Antibody Screen: NEGATIVE

## 2011-03-17 LAB — URINALYSIS, ROUTINE W REFLEX MICROSCOPIC
Bilirubin Urine: NEGATIVE
Hgb urine dipstick: NEGATIVE
Specific Gravity, Urine: 1.01 (ref 1.005–1.030)
Urobilinogen, UA: 0.2 mg/dL (ref 0.0–1.0)

## 2011-03-17 LAB — DIFFERENTIAL
Basophils Relative: 1 % (ref 0–1)
Eosinophils Absolute: 0.1 10*3/uL (ref 0.0–0.7)
Eosinophils Relative: 2 % (ref 0–5)
Monocytes Absolute: 0.4 10*3/uL (ref 0.1–1.0)
Monocytes Relative: 7 % (ref 3–12)

## 2011-03-17 LAB — BASIC METABOLIC PANEL
CO2: 30 mEq/L (ref 19–32)
Calcium: 9.5 mg/dL (ref 8.4–10.5)
Glucose, Bld: 56 mg/dL — ABNORMAL LOW (ref 70–99)
Sodium: 143 mEq/L (ref 135–145)

## 2011-03-17 LAB — CBC
HCT: 38.2 % (ref 36.0–46.0)
Hemoglobin: 12.8 g/dL (ref 12.0–15.0)
MCH: 29.8 pg (ref 26.0–34.0)
MCHC: 33.5 g/dL (ref 30.0–36.0)

## 2011-05-05 ENCOUNTER — Encounter: Payer: Self-pay | Admitting: Internal Medicine

## 2011-05-07 ENCOUNTER — Ambulatory Visit: Payer: BC Managed Care – PPO | Admitting: Internal Medicine

## 2011-05-13 ENCOUNTER — Other Ambulatory Visit: Payer: Self-pay | Admitting: Internal Medicine

## 2011-05-15 NOTE — Op Note (Signed)
NAME:  Kayla Sanford, Kayla Sanford NO.:  [id number]   MEDICAL RECORD NO.:  [id number]          PATIENT TYPE:  INP   LOCATION:  0001                         FACILITY:  [facility]   PHYSICIAN:  [name]. [name], [name]  DATE OF BIRTH:  06/29/1951   DATE OF PROCEDURE:  06/23/2007  DATE OF DISCHARGE:                               OPERATIVE REPORT   PREOPERATIVE DIAGNOSIS:  Left hip osteoarthritis.   POSTOPERATIVE DIAGNOSIS:  Left hip osteoarthritis.   FINDINGS:  The patient was noted radiographically to have some findings  that were consistent with a mild hip dysplasia with some lateralization  and valgus neck with a neutral retroverted acetabulum.   PROCEDURE:  Left total hip replacement.   COMPONENTS USED:  DePuy hip system with 46 ASR cup, a Tri-Lock size 3  high offset neck with a 41 plus 2 ASR ball and adapter   SURGEON:  [name]. [name], [name]   ASSISTANT:  Jamelle Rushing, P.A.-C.   ANESTHESIA:  Spinal.   BLOOD LOSS:  600.   COMPLICATIONS:  None.   DRAINS:  None.   INDICATIONS FOR PROCEDURE:  Kayla Sanford is a pleasant 60 year old  female who presented to the office for evaluation of bilateral hip pain.  Radiographs have indicated end-stage changes with mild findings for hip  dysplasia.  She had failed conservative measures and anti-  inflammatories, even injections of the hip which had provided  significant relief for a short period of time.  When she had failed this  for a second time she wished to proceed with hip replacement surgery.  Discussed risks, benefits, component failure, need for revision, DVT,  dislocation, as well as bearing surfaces and consent was obtained.   PROCEDURE IN DETAIL:  The patient was brought to the operative theater.  Once adequate anesthesia and preoperative antibiotics, 1 gram of Ancef,  were administered, the patient was positioned in the right lateral  decubitus position with the left side up.  Left lower extremity was then  prepped and draped in standard fashion following a prescrub.  The  lateral based incision was made for posterior approach to the hip.  The  iliotibial band and gluteus fascia were incised posteriorly.  Short  external rotators were taken down separate from the posterior capsule,  an L capsulotomy was made and preserved for later anatomic repair as  well as protection against the sciatic nerve with retractors.  Following  hip exposure, using a trial standard neck and head, neck osteotomy was  made.  At this point attention was directed to the femur.  Femoral  preparation was carried out per protocol with starting hand reamer,  irrigating to prevent fat emboli.  I broached up to a size 2 which was  at the level of my neck cut initially.   Please note that I did use a neck length initially of a 4.5 neck length.   Following this, I packed the femur and tented to the acetabulum.  Acetabulum appeared to be pretty small.  I reamed with a 43 reamer down  to the medial wall and then to a 45 which gave  me excellent bony bed  preparation and good coverage.  I decided to stop at this point.  I  impacted to 46 mm ASR cup at this point at 40 degrees of abduction and  20 degrees of forward flexion beneath the anterior rim.  This is where I  noticed some of the neutral position of the acetabulum.   At this point, I went back to a trial reduction initially with a size 2  stem in place.  I then used a high offset neck based on her radiograph  and the fact that I medialized her.   With the size 2 rim, the hip stability was great for range of motion,  but there was a few millimeters of shuck present with extension.  This  was even noted with her having a spinal.  I did not take down any of the  anterior capsule so this was still a good guide for soft tissue tension.   From a leg length comparison to the down leg it did appear to be short  as well.  I went ahead at this point and took out the size 2  broach and  trialed with a size 3.  With a size 3 in, the hip stability remained the  same from range of motion but it did tighten up from extension and  shuck.  I went ahead and used the size 3 high offset stem and impacted  to the level of  2 mm proud from my neck cut.  I then trialed again with  a +2 adapter and was very happy this range of motion and stability.  Final 41 ball with a +2 adapter was impacted onto a clean and dry  trunnion.  The hip was then reduced.  Throughout the case the hip was  irrigated and hemostasis obtained.  Posterior capsule was reapproximated  to superior leaflet using #1 Ethibond and then squirted 5 mL of FloSeal  into the posterior soft tissues.  The iliotibial band was reapproximated  using #1 Ethibond and #1 running Vicryl was used on the gluteal fascia.  The remainder of the wound was closed in layers with a 4-0 Monocryl.  Hip was cleaned, dried and dressed sterilely with Steri-Strips and a  Mepilex.  The patient was brought to the recovery room in stable  condition.      [name] [name], [name]  Electronically Signed     MDO/MEDQ  D:  06/23/2007  T:  06/23/2007  Job:  [id number]

## 2011-05-15 NOTE — H&P (Signed)
NAME:  Kayla Sanford, Kayla Sanford            ACCOUNT NO.:  [id number]   MEDICAL RECORD NO.:  [id number]          PATIENT TYPE:  INP   LOCATION:  NA                           FACILITY:  [facility]   PHYSICIAN:  [name]. [name], [name]  DATE OF BIRTH:  [birth year]   DATE OF ADMISSION:  11/03/2007  DATE OF DISCHARGE:                              HISTORY & PHYSICAL   CHIEF COMPLAINTS:  Right hip pain.   HISTORY OF PRESENT ILLNESS:  A 60 year old female with a history of  persistent progressive right hip and groin pain secondary to  osteoarthritis with a history of left total hip replacement back in June  2008.  She has progressed well since then.  Her right hip has been  refractory to all conservative treatment.  She had been pre-surgically  assessed by her primary care physician, Dr. Marga Melnick.   PAST MEDICAL HISTORY:  1. Osteoarthritis.  2. Hypertension.   FAMILY HISTORY:  Hypertension, stroke.   DRUG ALLERGIES:  PENICILLIN.   FOOD ALLERGIES:  MSG.   MEDICATIONS:  1. Toprol 50 mg p.o. daily.  2. Calcium citrate.  3. Vitamin B complex.  4. Diclofenac p.r.n.   REVIEW OF SYSTEMS:  See HPI.   PHYSICAL EXAMINATION:  VITAL SIGNS:  Pulse 72, respirations 18, blood  pressure 122/80.  GENERAL:  Awake, alert and oriented, well-developed, well-nourished in  no acute distress.  NECK:  Supple.  No carotid bruits.  CHEST/LUNGS:  Clear to auscultation bilaterally.  HEART:  Regular rate and rhythm without gallops, clicks, rubs or  murmurs.  ABDOMEN:  Soft, nontender, nondistended.  Bowel sounds are present.  GENITOURINARY:  Deferred.  EXTREMITIES:  Right hip increased pain with range of motion.  SKIN:  No cellulitis.  NEUROLOGIC:  Intact distal sensibilities.   DIAGNOSTICS:  1. EKG:  Pending presurgical testing.  2. Chest x-ray done in June.   IMPRESSION:  1. Osteoarthritis.  2. Hypertension.   PLAN:  Plan of action is a right total hip replacement by surgeon, Dr.  Durene Romans.  Risks  and complications were discussed.  Postoperative  medications given at time of history and physical.  Pain medicine will  be given at time of surgery.   The patient did have severe headaches during her last hospitalization  for her left hip, unknown if it was related to Vicodin, but did  discontinue the Vicodin during her course of stay and found that the  headaches resolved.  She did have more pain as a result.  It is hoped  that there will be alternative pain medicines that we can use during her  course of stay.     ______________________________  Yetta Glassman. [address], [place]      [name]. [name], [name]  Electronically Signed    BLM/MEDQ  D:  10/22/2007  T:  10/23/2007  Job:  [id number]   cc:   [name], MD,FACP,FCCP  [phone] [place]  [place] [address]

## 2011-05-15 NOTE — Op Note (Signed)
NAME:  Kayla Sanford, Kayla Sanford NO.:  [id number]   MEDICAL RECORD NO.:  [id number]          PATIENT TYPE:  INP   LOCATION:  1605                         FACILITY:  [facility]   PHYSICIAN:  [name]. [name], [name]  DATE OF BIRTH:  01-27-1951   DATE OF PROCEDURE:  11/03/2007  DATE OF DISCHARGE:                               OPERATIVE REPORT   PREOPERATIVE DIAGNOSIS:  Right hip osteoarthritis.   POSTOPERATIVE DIAGNOSIS:  Right hip osteoarthritis.   PROCEDURE:  Right total hip replacement.   COMPONENTS USED:  DePuy hip system size 48 ASR cup, size 4 high offset  trial lock stem, 43+ 2 ASR adapter.   SURGEON:  [name]. [name], [name]   ASSISTANT:  Yetta Glassman. Mann, PA   ANESTHESIA:  Duramorph spinal.   BLOOD LOSS:  350 mL.   DRAINS:  x1.   COMPLICATIONS:  None.   INDICATIONS FOR PROCEDURE:  Ms. Debnam is a 60 year old female with  bilateral hip osteoarthritis, status post left total hip replacement.  The hip and leg had progressed in the postoperative to the point where  she wished to go ahead and have it replaced.  We reviewed the risks and  benefits of the procedure and consent was obtained preoperatively.   PROCEDURE IN DETAIL:  The patient was brought to operative theater.  Once adequate anesthesia preoperative antibiotics, Ancef, were  administered.  The patient was positioned in left lateral decubitus  position with the right side carefully to prevent problems with the left  hip.  The bony prominences were padded.  The right hip was pre scrubbed  and prepped and draped in sterile fashion.  Lateral based incision was  made.  Sharp dissection was carried down to the iliotibial band.  The  iliotibial band and gluteus fascia incised posteriorly for posterior  approach to the hip.  The short external rotators were identified under  the posterior fat tissue and taken down separate from posterior capsule.   Due to exposure I did perform a posterior capsulectomy as  opposed to a  capsulotomy.  However, I did retain was posterior capsular tissue until  the end of the procedure to protect the sciatic nerve from retractors.  The hip was dislocated and based off anatomic landmarks, a neck cut was  made.  This neck cut was made based on the tip of the trochanter,  identified the landmarks.  Compared this to her left hip.   Following this I went ahead and attended to the femur by using a  starting drill hand reamer and then irrigated the canal to prevent fat  emboli.  Initially I broached up to a size 3 to level of my neck cut,  packed the femur off with a sponge and turned to the acetabular. Femoral  anteversion was approximately 25 to 30 degrees.   At this point acetabular exposure obtained.  It was clear that I had to  remove some of the superior capsule tissue that was rather adherent to  the capsular tissue.  I needed this for visualization given the  component that was utilized.  Following the labrectomy, I began  reaming  with a 43 reamer.  I ended up having to go up to a 47 reamer to get good  bony bed preparation.  This was down to the medial wall.  A subsequently  impacted based on the contralateral hip a 48 ASR hip cup.  The cup was  checked in position and found to be at about 40 degrees of abduction and  15 degrees of anteverted, forward flexion 15 to 20 degrees of forward  flexion.  Anatomically it was beneath the anterior rim and anteriorly  and it appeared to be level to the ischium posteriorly.  Trial reduction  then carried out with a size 3 broach with a high offset neck and a 43  ball.  The hip stability was very good.  The combined anteversion was  approximately 45 degrees; however, there was a significant amount of  shuck and her leg lengths appeared to be shortened compared to the  contralateral leg as compared to the preoperative position.  This reason  I removed the size 3 broach and used a size 4 broaches.  It sat a few   millimeters proud of my neck cut.  I retrialed, the leg lengths were  better.  There was a millimeter of  shuck in the hip and the stability  remained excellent, tolerating internal rotation to at least 80 degrees  without any evidence in 10 mm of subluxation.  At this point I removed  the trial components and chose a size 4 high offset stem.  This was  impacted to the level where the broach had been placed a few millimeters  proud.  I retrialed again with a +2 adapter and a 43 ball and was happy  with the leg lengths, stability and range of motion .  A +2 adapter was  impacted into the 43 final ball and onto a clean and dry trunnion and  the hip reduced.  I did remove some posterior osteophyte using osteotome  this was done without difficulty furthering my capsulectomy.  At this  point I irrigated the wound as I had done the whole time.  I did feel  tight band of tissue in the gluteus minimus and I release this without  any issues.  At this point I reapproximated the iliotibial band using a  #1 Ethibond.  I did place a medium Hemovac drain deep.  There is no  significant hemostasis was required.  The gluteal fascia was then run  with a #1 Vicryl and remainder of the wound was closed with 2-0 Vicryl  and running 4-0 Monocryl.  Hip was cleaned, dried and dressed sterilely  with a Mepilex dressing.  She was brought to recovery room in stable  condition.      [name] [name], [name]  Electronically Signed     MDO/MEDQ  D:  11/03/2007  T:  11/04/2007  Job:  [id number]

## 2011-05-15 NOTE — Letter (Signed)
May 23, 2007    Kayla Sanford. [name], [name]  [facility]  [address]  [address]  [place], [place] [address]   RE:  Kayla, Sanford  MRN:  [id number]  /  DOB:  1951/07/08   Dear Dr. [name],   Thank you for referring Kayla Sanford for preoperative clearance.   At this time, she is essentially asymptomatic, except for the severe  degenerative disease of her hips.  She states that she is in almost  constant pain.  Exercise is limited to stretching.  It precludes  climbing stairs.  The only relief she has had is following a series of  steroid injections into the joints on four separate occasions.  The pain  can be as high as 8 or 9 on a 10 scale.   Her past medical history includes two pregnancies, one delivery and an  ectopic pregnancy.  She has had two colonoscopies, which were negative.  She has had hypertension intermittently and is on metoprolol 50 once  daily.  She also has a history of  mild elevation of LDL and reduced  HDL, and is on no medications.  She is on no specific diet.   Her family history is positive for colon cancer in her father.  He also  had hypertension and myocardial infarction and stenting.  Maternal aunt  had breast cancer and gallstones.  Paternal grandfather had stroke.  Maternal grandmother had arthritis.   She has not smoked since 1984.  She drinks minimally.   She is allergic, by history, to PENICILLIN and MSG.   The review of systems indicates that palpitations  previously reported  are no longer a problem on the beta blocker.   She denies any other musculoskeletal or neuromuscular symptoms.  Specifically, she has no paresthesias or weakness.   There is no past history of any perioperative complications.  She has no  personal or family history of any bleeding dyscrasias.   Her weight is stable at 130, temperature is 98.2, pulse 62 and  respiratory rate 16.  Blood pressure is 120/78.   Her thyroid is slightly asymmetric in contour, but I do  not appreciate  nodules.  She has no lymphadenopathy about the head, neck or axillae.   Chest is clear.  No significant murmurs are noted.   Abdomen is scaphoid and well-muscled.  She has no organomegaly.   Pedal pulses are intact.  There is no edema.   She does have pain with hip rotation.   Her previous bone density was normal in June of 2006.  She should have a  repeat study in August 2008 of the forearms and spine.  This will be  arranged.   In addition to lipids, a vitamin D level will be checked today.   I see no contraindications to planned surgery.  Should medical issues  arise during the hospitalization following surgery on June 23 at [facility], please contact the [facility].  Thank you for you  care of this very nice lady.    Sincerely,      [name], MD,FACP,FCCP  Electronically Signed    [facility]/MedQ  DD: 05/23/2007  DT: 05/23/2007  Job #: [id number]

## 2011-05-15 NOTE — H&P (Signed)
NAME:  TABBY, BEASTON NO.:  [id number]   MEDICAL RECORD NO.:  [id number]          PATIENT TYPE:  INP   LOCATION:  NA                           FACILITY:  [facility]   PHYSICIAN:  [name]. [name], [name]  DATE OF BIRTH:  29-Jan-1951   DATE OF ADMISSION:  06/23/2007  DATE OF DISCHARGE:                              HISTORY & PHYSICAL   PROCEDURE:  Left total hip arthroplasty.   CHIEF COMPLAINT:  Left hip groin pain.   HISTORY OF PRESENT ILLNESS:  This is a 60 year old female with a history  of persistent progressive left hip groin pain secondary to  osteoarthritis.  She has  been refractory to all conservative  treatments.  She had been presurgical assessed by Dr. Grandville Silos.   PAST MEDICAL HISTORY:  1. Osteoarthritis.  2. Hypertension.   FAMILY HISTORY:  Significant for hypertension and stroke.   DRUG ALLERGIES:  PENICILLIN.   FOOD ALLERGIES:  MSG.   MEDICATIONS:  1. Metoprolol 50 mg ER one daily.  2. Vitamin D 50,000 units weekly.  3. Diclofenac 25 mg p.r.n.  4. Aleve p.r.n.  5. OsteoBiflex.  6. Glucosamine chondroitin with MSM triple strength 2 caplets daily.  7. Calcium citrate plus D 4 caplets daily.  8. Itraconazole 10%.  9. DMSO daily for 1 year.   REVIEW OF SYSTEMS:  GYNECOLOGIC:  Postmenopausal.  She had an ectopic  pregnancy with tubal removed in 1985.  Otherwise see HPI.   PHYSICAL EXAM:  VITAL SIGNS:  Pulse 64, respirations 18, blood pressure  140/82.  GENERAL:  She is awake, alert and oriented, well-developed, well-  nourished, no acute distress.  NECK:  Supple.  No carotid bruits.  CHEST/LUNGS:  Clear to auscultation bilaterally.  BREASTS:  Deferred.  HEART:  Regular rate and rhythm without gallops, clicks, rubs or  murmurs.  ABDOMEN:  Soft, nontender, nondistended.  Bowel sounds present all four  quadrants.  GENITOURINARY:  Deferred.  EXTREMITIES:  Left hip of painful range of motion.  Neuromuscular  vascularly intact.  SKIN:  No  cellulitis.  No skin changes.  NEUROLOGIC:  Intact distal sensibilities.   Labs, EKG, chest x-ray pending presurgical clearance.   IMPRESSION:  1. Left hip osteoarthritis.  2. Hypertension.   PLAN OF ACTION:  Left total hip arthroplasty June 23, 2007.  Surgeon,  Dr. Durene Romans.  Risks and complications were discussed.  Questions  were encouraged, answered and reviewed.     ______________________________  Yetta Glassman [address], [place]      [name]. [name], [name]  Electronically Signed    BLM/MEDQ  D:  06/19/2007  T:  06/19/2007  Job:  [id number]   cc:   [name], MD,FACP,FCCP  [phone] [place]  [place] [address]

## 2011-05-18 NOTE — Discharge Summary (Signed)
NAME:  Kayla Sanford, Kayla Sanford NO.:  [id number]   MEDICAL RECORD NO.:  [id number]          PATIENT TYPE:  INP   LOCATION:  1605                         FACILITY:  [facility]   PHYSICIAN:  [name]. [name], [name]  DATE OF BIRTH:  January 06, 1951   DATE OF ADMISSION:  11/03/2007  DATE OF DISCHARGE:  11/06/2007                               DISCHARGE SUMMARY   ADMITTING DIAGNOSIS:  1. Osteoarthritis  2. Hypertension.   DISCHARGE DIAGNOSIS:  1. Osteoarthritis.  2. Hypertension.   HISTORY OF PRESENT ILLNESS:  A 60 year old female with a history of  persistent progressive right hip and groin pain secondary to an  osteoarthritis with a history of left total hip replacement back into  2000.  She has done very well since then.  Her right hip has been  refractory to all conservative treatment, and was presurgical care by  her PCP, Dr. Marga Melnick.   CONSULTS:  None.   PROCEDURE:  A right total hip replacement.   SURGEON:  [name]. [name], [name]   ASSISTANT:  Yetta Glassman. Mann, PA.   COMPONENTS:  A metal-on-metal.   LABS:  Preadmission CBC:  Hematocrit 35 at discharge was 24.5 and stable  using iron.  CMET within normal limits at discharge, glucose follow up a  little below 123, BUN was 5, all others normal.  Kidney functions normal  GFR, calcium 8.8 at discharge.  UA negative.   RADIOLOGY:  1. Chest, 2-view, no active cardiopulmonary disease.  2. Portable pelvis satisfactory appearance of a right THR.   CARDIOLOGY:  Previous EKG showed normal sinus rhythm.  This was from  2008.   HOSPITAL COURSE:  The patient underwent right total hip placement,  tolerated procedure well.  She was admitted to orthopedic floor.  She  remained afebrile throughout her course stay.  She remained  neurovascularly intact in her right lower extremity throughout her  course of stay.  She progressed nicely with PT, OT, and weightbearing as  tolerated.  She did have some nausea and was given some  Zofran,  otherwise progressed nicely.   We changed her dressing on a daily basis after postop day #1 with no  significant drainage.  We Hep-Locked her IV by third day.  She was doing  well.  Strong to walk at least 50 feet with the use of a rolling walker,  and was ready for discharge home.   DISCHARGE DISPOSITION:  Discharged home in stable and improved condition  on home health care PT.   DISCHARGE PHYSICAL THERAPY:  Weightbearing as tolerated with the use of  a rolling walker for 2 weeks, and then transition to single-point cane.   DISCHARGE DIET:  Regular.   DISCHARGE WOUND CARE:  Keep dry.   DISCHARGE MEDICINES:  1. Lovenox 40 mg subcu q.24 h. times 11 days.  2. Robaxin 500 mg p.o. q.6 h.  3. Iron 325 mg one p.o. t.i.d. time three weeks.  4. Colace 100 mg p.o. b.i.d.  5. MiraLax 17 grams p.o. daily.  6. Tylenol 975 mg p.o. q.6 h. p.r.n. pain.  7. Toprol 50 mg p.o. daily.  8. Calcium citrate daily.  9. Vitamin B complex two daily.   DISCHARGE SPECIAL INSTRUCTIONS:  1. Follow up with Dr. [name] at phone number [phone] in ten to fourteen      days.     ______________________________  Yetta Glassman [address], [place]      [name]. [name], [name]  Electronically Signed    BLM/MEDQ  D:  11/25/2007  T:  11/25/2007  Job:  [id number]   cc:   [name], MD,FACP,FCCP  [phone] [place]  [place] [address]

## 2011-05-18 NOTE — Discharge Summary (Signed)
NAME:  Kayla Sanford, Kayla Sanford NO.:  [id number]   MEDICAL RECORD NO.:  [id number]          PATIENT TYPE:  INP   LOCATION:  1615                         FACILITY:  [facility]   PHYSICIAN:  [name]. [name], [name]  DATE OF BIRTH:  [birth year]   DATE OF ADMISSION:  06/23/2007  DATE OF DISCHARGE:  06/26/2007                               DISCHARGE SUMMARY   ADMISSION DIAGNOSES:  1. Osteoarthritis.  2. Hypertension.   DISCHARGE DIAGNOSES:  1. Osteoarthritis.  2. Hypertension.   CONSULTATION:  None.   PROCEDURE:  Left total hip replacement by Dr. Durene Romans, assistant  Oneida Alar.  Components were metal on metal components.   HISTORY OF PRESENT ILLNESS:  Mrs. Burback was a very pleasant 60-year-  old female with a history persistent progressive left hip and groin pain  secondary osteoarthritis.  Refractory treatment was failed.  She was  presurgical assessed by Dr. Marga Melnick.   LABORATORY DATA:  Preop CBC showed hematocrit 38.5.  Postop day #1  hematocrit 26.5, at discharge 22.7.  Coagulation within normal limits.  Routine chemistry all within normal limits preadmission.  Postoperative  day #1 glucose bumped up to 150. At discharge was 129.  Chemistry and  kidney function  all within normal limits.  GI all within normal limits.  UA negative.  Cardiology:  EKG normal sinus rhythm.  Radiology:  Chest  two-view showed no active cardiopulmonary disease.   HOSPITAL COURSE:  The patient underwent left total replacement.  Tolerated procedure well.  Admitted to orthopedic floor.  She remained  neurovascularly intact throughout.  Pain was well-controlled.  Dressing  was changed after postop day #1.  No active drainage from the wound.  DVT prophylaxis started on postop day #1.  She had a little bit of  volume depletion, given 500 mL bolus which helped with her pressure.  Progressed nicely with physical therapy.  Weightbearing as tolerated  with the use of rolling walker.  At  time of discharge she was able to  ambulate at least 100 feet unaided.   DISCHARGE DISPOSITION:  Discharged home health care PT, stable and  improved condition.   WOUND CARE:  Keep dry.   DISCHARGE DIET:  Regular.   DISCHARGE ACTIVITIES:  Physical therapy, weightbearing as tolerated with  use of rolling walker.   DISCHARGE FOLLOWUP:  1. Dr. [name], [phone], in two weeks.  2. If develops acute shortness of breath or severe calf pain, call      emergency service immediately.   DISCHARGE MEDICATIONS:  1. Vicodin 5/325 one to two p.o. q.4-6h.  2. Robaxin one p.o. q.6h.  3. Iron 325 mg one p.o. t.i.d.  4. Lovenox 40 mg one subcu q.24 for two weeks.  5. Enteric-coated aspirin 325 mg one p.o. daily after Lovenox      completed.  6. Colace 100 mg p.o. b.i.d.  7. Metoprolol 50 mg p.o. q.a.m.  8. Diclofenac 75 mg one p.o. b.i.d.  9. Vitamin D 50,000 units one weekly.  10.Itraconazole 10%, apply to toenails.  11.Osteo-BiFlex two daily.  12.Calcium plus D four daily.  13,  Tylenol PM p.r.n.  ______________________________  Yetta Glassman [address], [place]      [name]. [name], [name]  Electronically Signed    BLM/MEDQ  D:  07/22/2007  T:  07/22/2007  Job:  [id number]

## 2011-07-11 ENCOUNTER — Ambulatory Visit (INDEPENDENT_AMBULATORY_CARE_PROVIDER_SITE_OTHER): Payer: BC Managed Care – PPO | Admitting: *Deleted

## 2011-07-11 DIAGNOSIS — Z2911 Encounter for prophylactic immunotherapy for respiratory syncytial virus (RSV): Secondary | ICD-10-CM

## 2011-07-11 DIAGNOSIS — Z23 Encounter for immunization: Secondary | ICD-10-CM

## 2011-09-07 ENCOUNTER — Encounter (INDEPENDENT_AMBULATORY_CARE_PROVIDER_SITE_OTHER): Payer: Self-pay | Admitting: Surgery

## 2011-09-20 ENCOUNTER — Encounter (INDEPENDENT_AMBULATORY_CARE_PROVIDER_SITE_OTHER): Payer: BC Managed Care – PPO | Admitting: Surgery

## 2011-10-02 ENCOUNTER — Encounter (INDEPENDENT_AMBULATORY_CARE_PROVIDER_SITE_OTHER): Payer: Self-pay | Admitting: Surgery

## 2011-10-04 ENCOUNTER — Encounter (INDEPENDENT_AMBULATORY_CARE_PROVIDER_SITE_OTHER): Payer: Self-pay | Admitting: Surgery

## 2011-10-04 ENCOUNTER — Ambulatory Visit (INDEPENDENT_AMBULATORY_CARE_PROVIDER_SITE_OTHER): Payer: BC Managed Care – PPO | Admitting: Surgery

## 2011-10-04 VITALS — BP 110/74 | HR 60 | Temp 97.0°F | Resp 18 | Ht 64.0 in | Wt 123.2 lb

## 2011-10-04 DIAGNOSIS — R1909 Other intra-abdominal and pelvic swelling, mass and lump: Secondary | ICD-10-CM

## 2011-10-04 NOTE — Progress Notes (Signed)
This patient is status post laparoscopic cholecystectomy in December of 2011. She has been doing quite well but about 3 weeks ago she developed some redness and drainage from her umbilicus. She was treated by Dr. Perrin Maltese at [facility] and was placed on Silvadene ointment and doxycycline. The inflammation and drainage have completely healed. She was referred back to me for evaluation of a "separated bellybutton". She is having no symptoms here. She denies any discomfort.  On examination her umbilical incision is completely healed with no sign of infection. There are no openings or sinuses deep within her umbilicus. No drainage is noted. No erythema. She does have some firm scar tissue underneath her incision which would be expected.  Impression: No sign of any pathology in the umbilicus. There is no sign of umbilical hernia. If the patient begins expanders any symptoms related to her umbilicus a booklet to see her back on a p.r.n. basis.

## 2011-10-09 LAB — BASIC METABOLIC PANEL
CO2: 30
Calcium: 9.1
GFR calc Af Amer: >60
GFR calc non Af Amer: >60
GFR calc non Af Amer: >60
Glucose, Bld: 123 — ABNORMAL HIGH
Potassium: 4.2
Sodium: 139
Sodium: 142

## 2011-10-09 LAB — CBC
HCT: 24.5 — ABNORMAL LOW
Hemoglobin: 8.4 — ABNORMAL LOW
Hemoglobin: 8.9 — ABNORMAL LOW
MCHC: 34.4
RBC: 3 — ABNORMAL LOW
RDW: 17 — ABNORMAL HIGH
WBC: 4.9

## 2011-10-09 LAB — TYPE AND SCREEN
ABO/RH(D): A POS
Antibody Screen: NEGATIVE

## 2011-10-10 LAB — BASIC METABOLIC PANEL
BUN: 17
CO2: 31
Calcium: 9.8
GFR calc non Af Amer: >60
Glucose, Bld: 84

## 2011-10-10 LAB — URINALYSIS, ROUTINE W REFLEX MICROSCOPIC
Bilirubin Urine: NEGATIVE
Hgb urine dipstick: NEGATIVE
Ketones, ur: NEGATIVE
Nitrite: NEGATIVE
Urobilinogen, UA: 0.2

## 2011-10-17 LAB — CBC
HCT: 22.7 — ABNORMAL LOW
HCT: 26.5 — ABNORMAL LOW
HCT: 38.5
Hemoglobin: 12.8
Hemoglobin: 9.3 — ABNORMAL LOW
MCHC: 35
MCV: 89
MCV: 89.1
MCV: 89.5
Platelets: 159
Platelets: 253
RBC: 2.98 — ABNORMAL LOW
RDW: 14.3 — ABNORMAL HIGH
RDW: 14.5 — ABNORMAL HIGH
WBC: 3.6 — ABNORMAL LOW

## 2011-10-17 LAB — COMPREHENSIVE METABOLIC PANEL
Alkaline Phosphatase: 58
BUN: 17
Chloride: 107
Creatinine, Ser: 0.74
GFR calc non Af Amer: >60
Glucose, Bld: 91
Potassium: 4.4
Total Bilirubin: 0.5

## 2011-10-17 LAB — PROTIME-INR
INR: 1
Prothrombin Time: 12.8

## 2011-10-17 LAB — BASIC METABOLIC PANEL
BUN: 10
BUN: 6
CO2: 29
Calcium: 8.5
Creatinine, Ser: 0.7
GFR calc non Af Amer: >60
Glucose, Bld: 129 — ABNORMAL HIGH
Glucose, Bld: 150 — ABNORMAL HIGH
Potassium: 3.6
Sodium: 136

## 2011-10-17 LAB — APTT: aPTT: 29

## 2011-10-17 LAB — URINALYSIS, ROUTINE W REFLEX MICROSCOPIC
Glucose, UA: NEGATIVE
Hgb urine dipstick: NEGATIVE
Ketones, ur: NEGATIVE
Protein, ur: NEGATIVE
pH: 6

## 2011-10-17 LAB — TYPE AND SCREEN

## 2011-11-12 ENCOUNTER — Other Ambulatory Visit: Payer: Self-pay | Admitting: Internal Medicine

## 2011-11-12 MED ORDER — METOPROLOL SUCCINATE ER 50 MG PO TB24: ORAL_TABLET | ORAL | Status: DC

## 2011-11-12 NOTE — Telephone Encounter (Signed)
Patient needs to schedule a CPX  

## 2012-01-01 HISTORY — PX: UPPER GI ENDOSCOPY: SHX6162

## 2012-01-17 ENCOUNTER — Other Ambulatory Visit: Payer: Self-pay | Admitting: Obstetrics

## 2012-01-17 DIAGNOSIS — Z1231 Encounter for screening mammogram for malignant neoplasm of breast: Secondary | ICD-10-CM

## 2012-02-05 ENCOUNTER — Encounter: Payer: Self-pay | Admitting: Internal Medicine

## 2012-02-05 ENCOUNTER — Ambulatory Visit (INDEPENDENT_AMBULATORY_CARE_PROVIDER_SITE_OTHER): Payer: BC Managed Care – PPO | Admitting: Internal Medicine

## 2012-02-05 DIAGNOSIS — R194 Change in bowel habit: Secondary | ICD-10-CM

## 2012-02-05 DIAGNOSIS — R198 Other specified symptoms and signs involving the digestive system and abdomen: Secondary | ICD-10-CM

## 2012-02-05 DIAGNOSIS — E559 Vitamin D deficiency, unspecified: Secondary | ICD-10-CM

## 2012-02-05 DIAGNOSIS — E049 Nontoxic goiter, unspecified: Secondary | ICD-10-CM | POA: Insufficient documentation

## 2012-02-05 DIAGNOSIS — R1013 Epigastric pain: Secondary | ICD-10-CM

## 2012-02-05 NOTE — Progress Notes (Signed)
  Subjective:    Patient ID: Kayla Sanford, female    DOB: April 15, 1951, 61 y.o.   MRN: [id number]  HPI She's had a dermatitis in the umbilicus on 2 occasions; 1% silver sulfadiazine cream has improved this. The physician at the urgent care question of possible anomaly of the bellybutton. She saw her general surgeon who felt there was no anatomical abnormality.  She had hemorrhoidal banding over 3 visits in October, November, and December of 2012 by Dr. Kinnie Scales. Because of chronic constipation he recommended MiraLax every day. She had initial good response to this but recently has had midabdominal cramping discomfort. This resolved once she stopped taking the MiraLax.  She has been on a high protein, high roughage diet with increased amounts of fruits and vegetables recently. She has had a purposeful weight loss in preparation for a  vacation in the [place] at an [place] inclusive resort.    Review of Systems   She denies any rectal bleeding or melena.     Objective:   Physical Exam General appearance;thin but good health and nourishment w/o distress.  Eyes: No conjunctival inflammation or scleral icterus is present.EOMI ; no proptosis  Neck: The thyroid feels slightly enlarged; the contour is slightly irregular without definite nodularity  Oral exam: Dental hygiene is good; lips and gums are healthy appearing.There is no oropharyngeal erythema or exudate noted.   Heart:  Normal rate and regular rhythm. S1 and S2 normal without gallop, murmur, click, rub or other extra sounds     Lungs:Chest clear to auscultation; no wheezes, rhonchi,rales ,or rubs present.No increased work of breathing.   Abdomen: bowel sounds normal, soft and non-tender without masses, organomegaly or hernias noted.  No guarding or rebound .Aorta is palpable w/o AAA  Skin:Warm & dry.  Intact without suspicious lesions or rashes ; no jaundice or tenting. There is no evidence of infection @ the umbilicus Lymphatic: No  lymphadenopathy is noted about the head, neck, axilla              Assessment & Plan:    #1 recurrent cellulitis inside the umbilicus; preventive measures discussed. I would be concerned about recurrent mixed bacterial /candidal infections from moist retention.  #2 abdominal discomfort related to constipation  #3 chronic constipation responsive to MiraLax.  #4 thyroid enlargement/goiter.  #5 vitamin D deficiency; reassessment indicated  Plan: See orders and recommendations

## 2012-02-05 NOTE — Patient Instructions (Signed)
Increase roughage (fruits and vegetables) and your diet as recommended. Drink to thirst up to 40 ounces of water a day. Metamucil or other such agents may help the constipation. Take MiraLax every third night as needed for constipation.  

## 2012-02-06 LAB — TSH: TSH: 1.93 u[IU]/mL (ref 0.35–5.50)

## 2012-02-06 LAB — VITAMIN D 25 HYDROXY (VIT D DEFICIENCY, FRACTURES): Vit D, 25-Hydroxy: 48 ng/mL (ref 30–89)

## 2012-02-11 ENCOUNTER — Ambulatory Visit
Admission: RE | Admit: 2012-02-11 | Discharge: 2012-02-11 | Disposition: A | Discharge status: Home / Self Care | Payer: BC Managed Care – PPO | Source: Ambulatory Visit | Attending: Internal Medicine | Admitting: Internal Medicine

## 2012-02-11 DIAGNOSIS — E049 Nontoxic goiter, unspecified: Secondary | ICD-10-CM

## 2012-02-13 ENCOUNTER — Telehealth: Payer: Self-pay | Admitting: *Deleted

## 2012-02-13 NOTE — Telephone Encounter (Signed)
Pt called requesting lab results

## 2012-02-13 NOTE — Telephone Encounter (Signed)
Spoke with patient, patient aware of labs and U/S results. Patient aware reports were mailed to her

## 2012-02-29 ENCOUNTER — Other Ambulatory Visit: Payer: Self-pay | Admitting: Internal Medicine

## 2012-02-29 NOTE — Telephone Encounter (Signed)
Prescription sent to pharmacy.

## 2012-03-21 ENCOUNTER — Ambulatory Visit
Admission: RE | Admit: 2012-03-21 | Discharge: 2012-03-21 | Disposition: A | Discharge status: Home / Self Care | Payer: BC Managed Care – PPO | Source: Ambulatory Visit | Attending: Obstetrics | Admitting: Obstetrics

## 2012-03-21 DIAGNOSIS — Z1231 Encounter for screening mammogram for malignant neoplasm of breast: Secondary | ICD-10-CM

## 2012-06-16 ENCOUNTER — Encounter: Payer: Self-pay | Admitting: Internal Medicine

## 2012-06-16 ENCOUNTER — Ambulatory Visit (INDEPENDENT_AMBULATORY_CARE_PROVIDER_SITE_OTHER): Payer: BC Managed Care – PPO | Admitting: Internal Medicine

## 2012-06-16 ENCOUNTER — Other Ambulatory Visit: Payer: Self-pay | Admitting: Internal Medicine

## 2012-06-16 VITALS — BP 110/76 | HR 75 | Temp 99.0°F | Wt 117.0 lb

## 2012-06-16 DIAGNOSIS — R509 Fever, unspecified: Secondary | ICD-10-CM

## 2012-06-16 DIAGNOSIS — R197 Diarrhea, unspecified: Secondary | ICD-10-CM

## 2012-06-16 DIAGNOSIS — D72829 Elevated white blood cell count, unspecified: Secondary | ICD-10-CM

## 2012-06-16 DIAGNOSIS — R1084 Generalized abdominal pain: Secondary | ICD-10-CM

## 2012-06-16 DIAGNOSIS — R109 Unspecified abdominal pain: Secondary | ICD-10-CM

## 2012-06-16 LAB — CBC WITH DIFFERENTIAL/PLATELET
Basophils Absolute: 0 10*3/uL (ref 0.0–0.1)
HCT: 40.9 % (ref 36.0–46.0)
Lymphs Abs: 1 10*3/uL (ref 0.7–4.0)
MCV: 93.4 fl (ref 78.0–100.0)
Monocytes Absolute: 0.9 10*3/uL (ref 0.1–1.0)
Platelets: 217 10*3/uL (ref 150.0–400.0)
RDW: 14.5 % (ref 11.5–14.6)

## 2012-06-16 LAB — AMYLASE: Amylase: 110 U/L (ref 27–131)

## 2012-06-16 MED ORDER — METRONIDAZOLE 500 MG PO TABS: 500.0000 mg | ORAL_TABLET | Freq: Three times a day (TID) | ORAL | Status: AC

## 2012-06-16 MED ORDER — HYOSCYAMINE SULFATE 0.125 MG SL SUBL: 0.1250 mg | SUBLINGUAL_TABLET | SUBLINGUAL | Status: DC | PRN

## 2012-06-16 MED ORDER — CIPROFLOXACIN HCL 500 MG PO TABS: 500.0000 mg | ORAL_TABLET | Freq: Two times a day (BID) | ORAL | Status: AC

## 2012-06-16 MED ORDER — OMEPRAZOLE 20 MG PO CPDR: 20.0000 mg | DELAYED_RELEASE_CAPSULE | Freq: Two times a day (BID) | ORAL | Status: DC

## 2012-06-16 NOTE — Progress Notes (Signed)
  Subjective:    Patient ID: Kayla Sanford, female    DOB: 1951-10-18, 61 y.o.   MRN: [id number]  HPI Yesterday 06/15/12 she experienced acute onset of sharp midabdominal pain at brunch approximately 1 PM. The pain persisted up to a level 10 without radiation until this morning. Gas-X was a no significant benefit. Her brunch consisted of a non wheat bagel and honey from a national chain. She had a similar experience several months ago @ the same restaurant. She does describe chronic alternating constipation and diarrhea for 10 years. Yesterday she had 4-5 watery bowel movements. MSG has caused diarrhea; she did not ingest any foods with MSG yesterday. Past medical history/family history/social history were all reviewed and updated. Pertinent data: Her colonoscopies have been negative. She's had a cholecystectomy. Her father died of colon cancer in his 98s.    Review of Systems Nausea/Vomiting: no Constipation: last 1 week ago; MiraLax was recommended by her gastroenterologist but this has caused her stomach to be "upset" Melena/BRBPR: no Hematemesis: no  Anorexia:yes  Fever/Chills: She was unaware of any fever but did feel "achy" Dysuria/ hematuria/pyuria: no Rash: poison ivy on her arms has been treated with prednisone for 2 weeks by [facility]  Wt loss: no  EtOH use: occasional;caffeine: decaf coffee 3-4 cups/ day; decaf cola 1/day  NSAIDs/ASA: no LMP: no Vaginal bleeding: no        Objective:   Physical Exam General appearance :thin but well nourished. She appears tired  w/o acute  distress.  Eyes: No conjunctival inflammation or scleral icterus is present.  Oral exam: Dental hygiene is good; lips and gums are healthy appearing.There is no oropharyngeal erythema or exudate noted. Tongue coated  Heart:  Normal rate and regular rhythm. S1 and S2 normal without gallop, murmur, click, rub . S 4   Lungs:Chest clear to auscultation; no wheezes, rhonchi,rales ,or rubs  present.No increased work of breathing.   Abdomen: bowel sounds hyperactive, soft but slightly tender over aorta  without masses, organomegaly or hernias noted.  No guarding or rebound.Aorta palpable ; ? 5 cm in diameter    Skin:Warm & dry. Scattered irregular low-grade,, blanching erythema of the forearms without blistering ; no jaundice ;slight  tenting  Lymphatic: No lymphadenopathy is noted about the head, neck, axilla areas.           Assessment & Plan:    #1 acute, diffuse abdominal pain lasting hours and occurring in the postprandial situation. This may actually be a recurrent phenomena. 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 Because of the chronicity of her GI symptoms and the protracted nature of her symptoms; GI should be consulted.

## 2012-06-16 NOTE — Patient Instructions (Addendum)
The triggers for reflux  include stress; the "aspirin family" ; alcohol; peppermint; and caffeine (coffee, tea, cola, and chocolate). The aspirin family would include aspirin and the nonsteroidal agents such as ibuprofen &  Naproxen. Tylenol would not cause reflux. If having symptoms ; food & drink should be avoided for @ least 2 hours before going to bed.   Immodium AD as needed for frank diarrhea   Please complete  stool cards.  Please try to go on My Chart within the next 24 hours to allow me to release the results directly to you.

## 2012-06-18 ENCOUNTER — Telehealth: Payer: Self-pay

## 2012-06-18 NOTE — Telephone Encounter (Signed)
Metronidazole usually will not cause balance problems unless is taken with alcohol. It can be taking with alcohol even in  over-the-counter medications. If she can't take a half metronidazole 2 to 3 times a day; simply finish the Cipro. These would treat possible diverticulitis which was my clinical concern

## 2012-06-18 NOTE — Telephone Encounter (Signed)
I spoke with patient and she will try 1/2 by mouth tid until medication complete, if unable to tolerate she will finish cipro

## 2012-06-18 NOTE — Telephone Encounter (Signed)
Message left on  voicemail: patient took Flagyl 500 mg x 1 and it caused dizziness and she would no longer like to take medication. Patient would like another medication to take in place of the Flagyl.   Side Note: Allergies- PCN and Monosodium  Dr.Hopper please advise

## 2012-06-20 ENCOUNTER — Other Ambulatory Visit (INDEPENDENT_AMBULATORY_CARE_PROVIDER_SITE_OTHER): Payer: BC Managed Care – PPO

## 2012-06-20 ENCOUNTER — Ambulatory Visit (HOSPITAL_BASED_OUTPATIENT_CLINIC_OR_DEPARTMENT_OTHER)
Admission: RE | Admit: 2012-06-20 | Discharge: 2012-06-20 | Disposition: A | Discharge status: Home / Self Care | Payer: BC Managed Care – PPO | Source: Ambulatory Visit | Attending: Internal Medicine | Admitting: Internal Medicine

## 2012-06-20 DIAGNOSIS — Z1289 Encounter for screening for malignant neoplasm of other sites: Secondary | ICD-10-CM

## 2012-06-20 DIAGNOSIS — I7789 Other specified disorders of arteries and arterioles: Secondary | ICD-10-CM | POA: Insufficient documentation

## 2012-06-20 DIAGNOSIS — R109 Unspecified abdominal pain: Secondary | ICD-10-CM | POA: Insufficient documentation

## 2012-06-20 DIAGNOSIS — R1084 Generalized abdominal pain: Secondary | ICD-10-CM

## 2012-06-20 LAB — POC HEMOCCULT BLD/STL (OFFICE/1-CARD/DIAGNOSTIC): Fecal Occult Blood, POC: NEGATIVE

## 2012-06-25 ENCOUNTER — Other Ambulatory Visit (HOSPITAL_BASED_OUTPATIENT_CLINIC_OR_DEPARTMENT_OTHER): Payer: BC Managed Care – PPO

## 2013-02-10 ENCOUNTER — Other Ambulatory Visit: Payer: Self-pay | Admitting: Obstetrics

## 2013-02-10 DIAGNOSIS — Z1231 Encounter for screening mammogram for malignant neoplasm of breast: Secondary | ICD-10-CM

## 2013-02-14 ENCOUNTER — Other Ambulatory Visit: Payer: Self-pay

## 2013-02-17 ENCOUNTER — Other Ambulatory Visit: Payer: Self-pay | Admitting: Internal Medicine

## 2013-03-24 ENCOUNTER — Ambulatory Visit
Admission: RE | Admit: 2013-03-24 | Discharge: 2013-03-24 | Disposition: A | Discharge status: Home / Self Care | Payer: BC Managed Care – PPO | Source: Ambulatory Visit | Attending: Obstetrics | Admitting: Obstetrics

## 2013-03-24 DIAGNOSIS — Z1231 Encounter for screening mammogram for malignant neoplasm of breast: Secondary | ICD-10-CM

## 2013-05-22 ENCOUNTER — Other Ambulatory Visit: Payer: Self-pay | Admitting: General Practice

## 2013-05-22 MED ORDER — METOPROLOL SUCCINATE ER 50 MG PO TB24: ORAL_TABLET | ORAL | Status: DC

## 2013-06-15 ENCOUNTER — Encounter: Payer: Self-pay | Admitting: Internal Medicine

## 2013-06-15 ENCOUNTER — Ambulatory Visit (INDEPENDENT_AMBULATORY_CARE_PROVIDER_SITE_OTHER): Payer: BC Managed Care – PPO | Admitting: Internal Medicine

## 2013-06-15 VITALS — BP 118/80 | HR 53 | Temp 98.2°F | Resp 12 | Ht 64.0 in | Wt 122.4 lb

## 2013-06-15 DIAGNOSIS — E559 Vitamin D deficiency, unspecified: Secondary | ICD-10-CM

## 2013-06-15 DIAGNOSIS — I10 Essential (primary) hypertension: Secondary | ICD-10-CM

## 2013-06-15 DIAGNOSIS — K589 Irritable bowel syndrome without diarrhea: Secondary | ICD-10-CM

## 2013-06-15 DIAGNOSIS — Z01818 Encounter for other preprocedural examination: Secondary | ICD-10-CM

## 2013-06-15 MED ORDER — METOPROLOL SUCCINATE ER 50 MG PO TB24: ORAL_TABLET | ORAL | Status: DC

## 2013-06-15 NOTE — Patient Instructions (Addendum)
Please notify Dr.Olin's  Nurse that preoperative clearance has been completed and is in Epic for review

## 2013-06-15 NOTE — Progress Notes (Signed)
Subjective:    Patient ID: Kayla Sanford, female    DOB: 12-16-1951, 62 y.o.   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; cobalt & chromium levels elevated Tentative surgical date/Surgeon:07/20/13/Dr Lajoyce Corners Severity of pain: up to 10 standing up from chair Activity of daily living limitation/impairment of function: no  Treatment to date, efficacy: no treatment option other than surgery Significant past medical history:  Hypertension & IBS     Review of Systems HYPERTENSION follow-up: Home blood pressure  not monitored Patient is compliant with medications No adverse effects noted from medication Exercise program as walking & stationary bike 6  times per week for  20 minutes Avoiding artificial sweeteners, carbonated beverages, or caffeine to treat IBS with constipation. No chest pain, palpitations, dyspnea, claudication,edema or paroxysmal nocturnal dyspnea described. No significant lightheadedness, headache, epistaxis, or syncope.  IBS: Resolution with water and prune juice. She denies abdominal pain, unexplained weight loss, melena, or rectal bleeding.          Objective:   Physical Exam  Gen.: Thin but healthy and well-nourished in appearance. Alert, appropriate and cooperative throughout exam. Head: Normocephalic without obvious abnormalities Eyes: No corneal or conjunctival inflammation noted. Extraocular motion intact.  Ears: External  ear exam reveals no significant lesions or deformities. Canals clear .TMs normal. Hearing is grossly normal bilaterally. Nose: External nasal exam reveals no deformity or inflammation. Nasal mucosa are pink and moist. No lesions or exudates noted.  Mouth: Oral mucosa and oropharynx reveal no lesions or exudates. Teeth in good repair. Neck: No deformities, masses, or tenderness noted.  Thyroid normal. Lungs: Normal respiratory effort; chest expands symmetrically. Lungs  are clear to auscultation without rales, wheezes, or increased work of breathing. Heart: Normal rate and rhythm. Split S1 ;accentuated S2. No gallop, click, or rub. No murmur. Abdomen: Bowel sounds normal; abdomen soft and nontender. No masses, organomegaly or hernias noted.Aorta palpable ; no AAA Genitalia: As per Gyn                                  Musculoskeletal/extremities: No deformity or scoliosis noted of  the thoracic or lumbar spine.  No clubbing, cyanosis, edema, or significant extremity  deformity noted. Range of motion normal but pain @ R hip with ROM .Tone & strength  Normal. Joints normal . Osteoma R wrist.Nail health good. Able to lie down & sit up w/o help. Negative SLR bilaterally Vascular: Carotid, radial artery, dorsalis pedis and  posterior tibial pulses are full and equal. No bruits present. Neurologic: Alert and oriented x3. Deep tendon reflexes symmetrical and normal.          Skin: Intact without suspicious lesions or rashes. Lymph: No cervical, axillary, or inguinal lymphadenopathy present. Psych: Mood and affect are normal. 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

## 2013-06-16 LAB — CBC WITH DIFFERENTIAL/PLATELET
Basophils Relative: 0.5 % (ref 0.0–3.0)
Eosinophils Relative: 1.9 % (ref 0.0–5.0)
Hemoglobin: 12.9 g/dL (ref 12.0–15.0)
Lymphocytes Relative: 36 % (ref 12.0–46.0)
Monocytes Relative: 7.4 % (ref 3.0–12.0)
Neutro Abs: 2.4 10*3/uL (ref 1.4–7.7)
Neutrophils Relative %: 54.2 % (ref 43.0–77.0)
RBC: 4.25 Mil/uL (ref 3.87–5.11)
WBC: 4.4 10*3/uL — ABNORMAL LOW (ref 4.5–10.5)

## 2013-06-16 LAB — BASIC METABOLIC PANEL
CO2: 30 mEq/L (ref 19–32)
Chloride: 105 mEq/L (ref 96–112)
Creatinine, Ser: 0.9 mg/dL (ref 0.4–1.2)
Potassium: 4.1 mEq/L (ref 3.5–5.1)
Sodium: 141 mEq/L (ref 135–145)

## 2013-06-19 LAB — VITAMIN D 1,25 DIHYDROXY
Vitamin D 1, 25 (OH)2 Total: 46 pg/mL (ref 18–72)
Vitamin D2 1, 25 (OH)2: <8 pg/mL
Vitamin D3 1, 25 (OH)2: 46 pg/mL

## 2013-07-09 ENCOUNTER — Encounter (HOSPITAL_COMMUNITY): Payer: Self-pay | Admitting: Pharmacy Technician

## 2013-07-13 ENCOUNTER — Encounter (HOSPITAL_COMMUNITY)
Admission: RE | Admit: 2013-07-13 | Discharge: 2013-07-13 | Disposition: A | Discharge status: Home / Self Care | Payer: BC Managed Care – PPO | Source: Ambulatory Visit | Attending: Orthopedic Surgery | Admitting: Orthopedic Surgery

## 2013-07-13 ENCOUNTER — Ambulatory Visit (HOSPITAL_COMMUNITY)
Admission: RE | Admit: 2013-07-13 | Discharge: 2013-07-13 | Disposition: A | Discharge status: Home / Self Care | Payer: BC Managed Care – PPO | Source: Ambulatory Visit | Attending: Orthopedic Surgery | Admitting: Orthopedic Surgery

## 2013-07-13 ENCOUNTER — Encounter (HOSPITAL_COMMUNITY): Payer: Self-pay

## 2013-07-13 DIAGNOSIS — Z01812 Encounter for preprocedural laboratory examination: Secondary | ICD-10-CM | POA: Insufficient documentation

## 2013-07-13 DIAGNOSIS — Z01818 Encounter for other preprocedural examination: Secondary | ICD-10-CM | POA: Insufficient documentation

## 2013-07-13 HISTORY — DX: Headache: R51

## 2013-07-13 HISTORY — DX: Other specified postprocedural states: R11.2

## 2013-07-13 HISTORY — DX: Other specified postprocedural states: Z98.890

## 2013-07-13 LAB — URINALYSIS, ROUTINE W REFLEX MICROSCOPIC
Ketones, ur: NEGATIVE mg/dL
Leukocytes, UA: NEGATIVE
Nitrite: NEGATIVE
Protein, ur: NEGATIVE mg/dL
Urobilinogen, UA: 0.2 mg/dL (ref 0.0–1.0)

## 2013-07-13 LAB — PROTIME-INR
INR: 0.95 (ref 0.00–1.49)
Prothrombin Time: 12.5 seconds (ref 11.6–15.2)

## 2013-07-13 LAB — SURGICAL PCR SCREEN: Staphylococcus aureus: INVALID — AB

## 2013-07-13 LAB — CBC
MCV: 89.8 fL (ref 78.0–100.0)
Platelets: 250 10*3/uL (ref 150–400)
RBC: 4.21 MIL/uL (ref 3.87–5.11)
WBC: 4.3 10*3/uL (ref 4.0–10.5)

## 2013-07-13 LAB — APTT: aPTT: 31 seconds (ref 24–37)

## 2013-07-13 LAB — BASIC METABOLIC PANEL
CO2: 32 mEq/L (ref 19–32)
Calcium: 9.9 mg/dL (ref 8.4–10.5)
GFR calc Af Amer: 83 mL/min — ABNORMAL LOW (ref >90)
GFR calc non Af Amer: 71 mL/min — ABNORMAL LOW (ref >90)
Sodium: 142 mEq/L (ref 135–145)

## 2013-07-13 LAB — URINE MICROSCOPIC-ADD ON: Urine-Other: NONE SEEN

## 2013-07-13 NOTE — Progress Notes (Signed)
Surgery clearance note Dr. Alwyn Ren 06/15/13 on chart, EKG 06/15/13 on EPIC

## 2013-07-13 NOTE — Patient Instructions (Addendum)
[address]  07/13/2013   Your procedure is scheduled on: 07/20/13  Report to [facility] at 5:15 AM.  Call this number if you have problems the morning of surgery [phone]   Remember:   Do not eat food or drink liquids After Midnight.     Do not wear jewelry, make-up or nail polish.  Do not wear lotions, powders, or perfumes. You may wear deodorant.  Do not shave 48 hours prior to surgery. Men may shave face and neck.  Do not bring valuables to the hospital.  Contacts, dentures or bridgework may not be worn into surgery.  Leave suitcase in the car. After surgery it may be brought to your room.  For patients admitted to the hospital, checkout time is 11:00 AM the day of discharge.    Please read over the following fact sheets that you were given: MRSA Information, blood fact sheet, incentive spirometry fact sheet Birdie Sons, RN  pre op nurse call if needed [phone]    FAILURE TO FOLLOW THESE INSTRUCTIONS MAY RESULT IN CANCELLATION OF YOUR SURGERY   Patient Signature: ___________________________________________

## 2013-07-19 NOTE — H&P (Signed)
TOTAL HIP REVISION ADMISSION H&P  Patient is admitted for right revision total hip arthroplasty.  Subjective:  Chief Complaint: right hip pain  HPI: Kayla Sanford, 62 y.o. 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  Patient has failed non-surgical conservative treatments for greater than 12 weeks to include NSAID's and/or analgesics, supervised PT with diminished ADL's post treatment, use of assistive devices and activity modification. The indications for the revision total hip arthroplasty are bearing surface wear leading to  symptomatic synovitis.  Onset of symptoms was gradual starting 6 years ago with gradually worsening course since that time.  Prior procedures on the right hip include arthroplasty,per Dr. [name] in 2008. Patient currently rates pain in the right hip at 7 out of 10 with activity.  There is worsening of pain with activity and weight bearing, trendelenberg gait, pain that interfers with activities of daily living, pain with passive range of motion and getting up from a seated position. 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  This condition presents safety issues increasing the risk of falls.  This patient has had evidence of elevate lab work in the clinic.  There is no current active signs of infection.  Risks, benefits and expectations were discussed with the patient. Patient understand the risks, benefits and expectations and wishes to proceed with surgery.   D/C Plans:   Home with HHPT  Post-op Meds:   No Rx given   Tranexamic Acid:   To be given  Decadron:    To be given  FYI:    Would like to try only APAP for pain.  Some concern of ASA and gastritis, Xarelto post-op   Patient Active Problem List   Diagnosis Date Noted  . IBS (irritable bowel syndrome) 06/15/2013  . Goiter 02/05/2012  . LEUKOCYTOPENIA UNSPECIFIED 11/22/2010  .  LYMPHOCYTOSIS 11/22/2010  . NONSPEC ELEVATION OF LEVELS OF TRANSAMINASE/LDH 11/22/2010  . MITRAL REGURGITATION, 0 (MILD) 07/13/2010  . UNS ADVRS EFF UNS RX MEDICINAL&BIOLOGICAL SBSTNC 07/13/2010  . POSTMENOPAUSAL SYNDROME 05/03/2009  . 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 VITAMIN D DEFICIENCY 10/22/2007  . HYPERTENSION, ESSENTIAL NOS 10/22/2007   Past Medical History  Diagnosis Date  . Hypertension   . Arthritis   . Constipation, chronic   . IBS (irritable bowel syndrome)   . Headache(784.0)   . PONV (postoperative nausea and vomiting)     severe headache from medicine, recently had patch which helped. tylenol after surgery    Past Surgical History  Procedure Laterality Date  . Total hip arthroplasty  06/2007, 11/2007    Left 06/2007 and 2011, Right 11/2007, Dr.Olin  . Ectopic pregnancy surgery  1985  . Colonoscopy  2002,2007,2012    Dr. Ritta Slot  . Tubal ligation  1985    left   . Cholecystectomy  2011    Dr Corliss Skains  . Hemorrhoidal banding  Oct-Dec 2012    Dr Kinnie Scales  . Upper gi endoscopy  2013    gastritis    No prescriptions prior to admission   Allergies  Allergen Reactions  . Monosodium Glutamate     Diarrhea, cramps  . Penicillins     Her pediatrician said she might be allergic because of repeated poison ivy outbreaks in the 1950s    History  Substance Use Topics  . Smoking status: Former Smoker -- 0.25 packs/day for 5 years    Types: Cigarettes  Quit date: 12/31/1978  . Smokeless tobacco: Never Used     Comment: smoked 1975-1980, up to 1/2 ppd  . Alcohol Use: No    Family History  Problem Relation Age of Onset  . Colon cancer Father 58  . Hypertension Father   . Heart attack Father 41  . Alzheimer's disease Mother   . Cholelithiasis Mother   . Breast cancer Maternal Aunt   . Stroke Paternal Grandfather   . Arthritis Paternal Grandmother   . Diabetes Neg Hx   . Colon polyps Mother       Review of Systems  Constitutional:  Negative.   Eyes: Negative.   Respiratory: Negative.   Cardiovascular: Negative.   Gastrointestinal: Positive for constipation.  Genitourinary: Negative.   Musculoskeletal: Positive for joint pain.  Skin: Negative.   Neurological: Positive for headaches.  Endo/Heme/Allergies: Negative.   Psychiatric/Behavioral: Negative.     Objective:  Physical Exam  Constitutional: She is oriented to person, place, and time. She appears well-developed and well-nourished.  HENT:  Head: Normocephalic and atraumatic.  Mouth/Throat: Oropharynx is clear and moist.  Eyes: Pupils are equal, round, and reactive to light.  Neck: Neck supple. No JVD present. No tracheal deviation present.  Cardiovascular: Normal rate, regular rhythm, normal heart sounds and intact distal pulses.   Respiratory: Effort normal and breath sounds normal. No stridor. No respiratory distress. She has no wheezes.  GI: Soft. There is no tenderness. There is no guarding.  Musculoskeletal:       Right hip: She exhibits decreased range of motion, decreased strength, tenderness, bony tenderness and laceration. She exhibits no swelling and no deformity.  Lymphadenopathy:    She has no cervical adenopathy.  Neurological: She is alert and oriented to person, place, and time.  Skin: Skin is warm and dry.  Psychiatric: She has a normal mood and affect.    Labs:  Estimated body mass index is 20.07 kg/(m^2) as calculated from the following:   Height as of 10/04/11: 5\' 4"  (1.626 m).   Weight as of 06/16/12: 53.071 kg (117 lb).  Imaging Review:  Plain radiographs demonstrate previous arthroplasty of the right hip(s). There is evidence of previous metal-on-metal hip arthroplasty.The bone quality appears to be good for age and reported activity level.  Assessment/Plan:  End stage arthritis, right hip(s) with failed previous arthroplasty.  The patient history, physical examination, clinical judgement of the provider and imaging studies  are consistent with end stage degenerative joint disease of the right hip(s), previous total hip arthroplasty. Revision total hip arthroplasty is deemed medically necessary. The treatment options including medical management, injection therapy, arthroscopy and arthroplasty were discussed at length. The risks and benefits of total hip arthroplasty were presented and reviewed. The risks due to aseptic loosening, infection, stiffness, dislocation/subluxation,  thromboembolic complications and other imponderables were discussed.  The patient acknowledged the explanation, agreed to proceed with the plan and consent was signed. Patient is being admitted for inpatient treatment for surgery, pain control, PT, OT, prophylactic antibiotics, VTE prophylaxis, progressive ambulation and ADL's and discharge planning. The patient is planning to be discharged home with home health services.    Anastasio Auerbach Claudette Wermuth   PAC  07/19/2013, 7:28 AM

## 2013-07-20 ENCOUNTER — Inpatient Hospital Stay (HOSPITAL_COMMUNITY): Payer: BC Managed Care – PPO

## 2013-07-20 ENCOUNTER — Encounter (HOSPITAL_COMMUNITY): Payer: Self-pay | Admitting: Anesthesiology

## 2013-07-20 ENCOUNTER — Encounter (HOSPITAL_COMMUNITY): Payer: Self-pay | Admitting: *Deleted

## 2013-07-20 ENCOUNTER — Inpatient Hospital Stay (HOSPITAL_COMMUNITY)
Admission: RE | Admit: 2013-07-20 | Discharge: 2013-07-21 | DRG: 817 | Disposition: A | Discharge status: Home Health | Payer: BC Managed Care – PPO | Source: Ambulatory Visit | Attending: Orthopedic Surgery | Admitting: Orthopedic Surgery

## 2013-07-20 ENCOUNTER — Encounter (HOSPITAL_COMMUNITY)
Admission: RE | Disposition: A | Discharge status: Home Health | Payer: Self-pay | Source: Ambulatory Visit | Attending: Orthopedic Surgery

## 2013-07-20 ENCOUNTER — Ambulatory Visit (HOSPITAL_COMMUNITY): Payer: BC Managed Care – PPO | Admitting: Anesthesiology

## 2013-07-20 DIAGNOSIS — T84099A Other mechanical complication of unspecified internal joint prosthesis, initial encounter: Principal | ICD-10-CM | POA: Diagnosis present

## 2013-07-20 DIAGNOSIS — K5900 Constipation, unspecified: Secondary | ICD-10-CM | POA: Diagnosis not present

## 2013-07-20 DIAGNOSIS — R7989 Other specified abnormal findings of blood chemistry: Secondary | ICD-10-CM | POA: Diagnosis present

## 2013-07-20 DIAGNOSIS — D500 Iron deficiency anemia secondary to blood loss (chronic): Secondary | ICD-10-CM

## 2013-07-20 DIAGNOSIS — I10 Essential (primary) hypertension: Secondary | ICD-10-CM | POA: Diagnosis present

## 2013-07-20 DIAGNOSIS — Z96649 Presence of unspecified artificial hip joint: Secondary | ICD-10-CM

## 2013-07-20 DIAGNOSIS — Y831 Surgical operation with implant of artificial internal device as the cause of abnormal reaction of the patient, or of later complication, without mention of misadventure at the time of the procedure: Secondary | ICD-10-CM | POA: Diagnosis present

## 2013-07-20 DIAGNOSIS — M1610 Unilateral primary osteoarthritis, unspecified hip: Secondary | ICD-10-CM | POA: Diagnosis present

## 2013-07-20 DIAGNOSIS — M169 Osteoarthritis of hip, unspecified: Secondary | ICD-10-CM | POA: Diagnosis present

## 2013-07-20 DIAGNOSIS — D62 Acute posthemorrhagic anemia: Secondary | ICD-10-CM | POA: Diagnosis not present

## 2013-07-20 HISTORY — PX: TOTAL HIP REVISION: SHX763

## 2013-07-20 LAB — TYPE AND SCREEN: Antibody Screen: NEGATIVE

## 2013-07-20 SURGERY — TOTAL HIP REVISION
Anesthesia: Spinal | Site: Hip | Laterality: Right | Wound class: Clean

## 2013-07-20 MED ORDER — CELECOXIB 200 MG PO CAPS
200.0000 mg | ORAL_CAPSULE | Freq: Once | ORAL | Status: AC
  Administered 2013-07-20: 200 mg via ORAL
  Filled 2013-07-20: qty 1

## 2013-07-20 MED ORDER — CLINDAMYCIN PHOSPHATE 900 MG/50ML IV SOLN
INTRAVENOUS | Status: AC
  Filled 2013-07-20: qty 50

## 2013-07-20 MED ORDER — PHENOL 1.4 % MT LIQD: 1.0000 | OROMUCOSAL | Status: DC | PRN

## 2013-07-20 MED ORDER — DEXAMETHASONE SODIUM PHOSPHATE 10 MG/ML IJ SOLN
10.0000 mg | Freq: Once | INTRAMUSCULAR | Status: AC
  Administered 2013-07-21: 10 mg via INTRAVENOUS
  Filled 2013-07-20: qty 1

## 2013-07-20 MED ORDER — TRETINOIN 0.025 % EX CREA: TOPICAL_CREAM | Freq: Every day | CUTANEOUS | Status: DC

## 2013-07-20 MED ORDER — OXYCODONE HCL 5 MG PO TABS: 5.0000 mg | ORAL_TABLET | Freq: Once | ORAL | Status: DC | PRN

## 2013-07-20 MED ORDER — CLINDAMYCIN PHOSPHATE 600 MG/50ML IV SOLN
600.0000 mg | Freq: Four times a day (QID) | INTRAVENOUS | Status: AC
  Administered 2013-07-20 – 2013-07-21 (×2): 600 mg via INTRAVENOUS
  Filled 2013-07-20 (×2): qty 50

## 2013-07-20 MED ORDER — ALUM & MAG HYDROXIDE-SIMETH 200-200-20 MG/5ML PO SUSP: 30.0000 mL | ORAL | Status: DC | PRN

## 2013-07-20 MED ORDER — POLYETHYLENE GLYCOL 3350 17 G PO PACK: 17.0000 g | PACK | Freq: Two times a day (BID) | ORAL | Status: DC

## 2013-07-20 MED ORDER — CELECOXIB 200 MG PO CAPS
ORAL_CAPSULE | ORAL | Status: AC
  Filled 2013-07-20: qty 1

## 2013-07-20 MED ORDER — ONDANSETRON HCL 4 MG PO TABS: 4.0000 mg | ORAL_TABLET | Freq: Four times a day (QID) | ORAL | Status: DC | PRN

## 2013-07-20 MED ORDER — OXYCODONE HCL 5 MG/5ML PO SOLN
5.0000 mg | Freq: Once | ORAL | Status: DC | PRN
  Filled 2013-07-20: qty 5

## 2013-07-20 MED ORDER — TRANEXAMIC ACID 100 MG/ML IV SOLN
1000.0000 mg | Freq: Once | INTRAVENOUS | Status: DC
  Filled 2013-07-20: qty 10

## 2013-07-20 MED ORDER — HYDROMORPHONE HCL PF 1 MG/ML IJ SOLN: 0.2500 mg | INTRAMUSCULAR | Status: DC | PRN

## 2013-07-20 MED ORDER — FLEET ENEMA 7-19 GM/118ML RE ENEM: 1.0000 | ENEMA | Freq: Once | RECTAL | Status: AC | PRN

## 2013-07-20 MED ORDER — RIVAROXABAN 10 MG PO TABS
10.0000 mg | ORAL_TABLET | Freq: Every day | ORAL | Status: DC
  Administered 2013-07-21: 10 mg via ORAL
  Filled 2013-07-20: qty 1

## 2013-07-20 MED ORDER — METOPROLOL SUCCINATE ER 50 MG PO TB24
50.0000 mg | ORAL_TABLET | Freq: Every evening | ORAL | Status: DC
Start: 2013-07-20 — End: 2013-07-21
  Administered 2013-07-20: 50 mg via ORAL
  Filled 2013-07-20 (×2): qty 1

## 2013-07-20 MED ORDER — DIPHENHYDRAMINE HCL 25 MG PO CAPS
25.0000 mg | ORAL_CAPSULE | Freq: Four times a day (QID) | ORAL | Status: DC | PRN
  Administered 2013-07-20: 25 mg via ORAL
  Filled 2013-07-20: qty 1

## 2013-07-20 MED ORDER — SCOPOLAMINE 1 MG/3DAYS TD PT72
MEDICATED_PATCH | TRANSDERMAL | Status: DC | PRN
  Administered 2013-07-20: 1 via TRANSDERMAL

## 2013-07-20 MED ORDER — SIMETHICONE 80 MG PO CHEW
80.0000 mg | CHEWABLE_TABLET | Freq: Four times a day (QID) | ORAL | Status: DC | PRN
  Filled 2013-07-20: qty 1

## 2013-07-20 MED ORDER — 0.9 % SODIUM CHLORIDE (POUR BTL) OPTIME
TOPICAL | Status: DC | PRN
  Administered 2013-07-20: 1000 mL

## 2013-07-20 MED ORDER — ZOLPIDEM TARTRATE 5 MG PO TABS: 5.0000 mg | ORAL_TABLET | Freq: Every evening | ORAL | Status: DC | PRN

## 2013-07-20 MED ORDER — ACETAMINOPHEN 500 MG PO TABS
1000.0000 mg | ORAL_TABLET | Freq: Once | ORAL | Status: AC
  Administered 2013-07-20: 1000 mg via ORAL

## 2013-07-20 MED ORDER — ACETAMINOPHEN 500 MG PO TABS
1000.0000 mg | ORAL_TABLET | Freq: Four times a day (QID) | ORAL | Status: DC
  Administered 2013-07-20: 1000 mg via ORAL
  Filled 2013-07-20: qty 2

## 2013-07-20 MED ORDER — STERILE WATER FOR IRRIGATION IR SOLN
Status: DC | PRN
  Administered 2013-07-20: 3000 mL

## 2013-07-20 MED ORDER — BISACODYL 10 MG RE SUPP: 10.0000 mg | Freq: Every day | RECTAL | Status: DC | PRN

## 2013-07-20 MED ORDER — ACETAMINOPHEN 650 MG RE SUPP
650.0000 mg | Freq: Four times a day (QID) | RECTAL | Status: DC
  Filled 2013-07-20 (×4): qty 1

## 2013-07-20 MED ORDER — HYDROMORPHONE HCL PF 1 MG/ML IJ SOLN
0.5000 mg | INTRAMUSCULAR | Status: DC | PRN
Start: 2013-07-20 — End: 2013-07-21

## 2013-07-20 MED ORDER — ACETAMINOPHEN 500 MG PO TABS
1000.0000 mg | ORAL_TABLET | Freq: Four times a day (QID) | ORAL | Status: DC
  Administered 2013-07-20 – 2013-07-21 (×3): 1000 mg via ORAL
  Filled 2013-07-20 (×6): qty 2

## 2013-07-20 MED ORDER — METHOCARBAMOL 500 MG PO TABS: 500.0000 mg | ORAL_TABLET | Freq: Four times a day (QID) | ORAL | Status: DC | PRN

## 2013-07-20 MED ORDER — DEXAMETHASONE SODIUM PHOSPHATE 10 MG/ML IJ SOLN: 10.0000 mg | Freq: Once | INTRAMUSCULAR | Status: DC

## 2013-07-20 MED ORDER — DOCUSATE SODIUM 100 MG PO CAPS: 100.0000 mg | ORAL_CAPSULE | Freq: Two times a day (BID) | ORAL | Status: DC

## 2013-07-20 MED ORDER — CELECOXIB 200 MG PO CAPS
200.0000 mg | ORAL_CAPSULE | Freq: Two times a day (BID) | ORAL | Status: DC
  Filled 2013-07-20 (×4): qty 1

## 2013-07-20 MED ORDER — FERROUS SULFATE 325 (65 FE) MG PO TABS
325.0000 mg | ORAL_TABLET | Freq: Three times a day (TID) | ORAL | Status: DC
  Administered 2013-07-20 – 2013-07-21 (×2): 325 mg via ORAL
  Filled 2013-07-20 (×6): qty 1

## 2013-07-20 MED ORDER — METOCLOPRAMIDE HCL 10 MG PO TABS: 5.0000 mg | ORAL_TABLET | Freq: Three times a day (TID) | ORAL | Status: DC | PRN

## 2013-07-20 MED ORDER — MENTHOL 3 MG MT LOZG: 1.0000 | LOZENGE | OROMUCOSAL | Status: DC | PRN

## 2013-07-20 MED ORDER — MEPERIDINE HCL 50 MG/ML IJ SOLN: 6.2500 mg | INTRAMUSCULAR | Status: DC | PRN

## 2013-07-20 MED ORDER — LACTATED RINGERS IV SOLN
INTRAVENOUS | Status: DC | PRN
  Administered 2013-07-20: 07:00:00 via INTRAVENOUS

## 2013-07-20 MED ORDER — METHOCARBAMOL 100 MG/ML IJ SOLN
500.0000 mg | Freq: Four times a day (QID) | INTRAVENOUS | Status: DC | PRN
  Filled 2013-07-20: qty 5

## 2013-07-20 MED ORDER — TRAMADOL HCL 50 MG PO TABS: 50.0000 mg | ORAL_TABLET | Freq: Four times a day (QID) | ORAL | Status: DC | PRN

## 2013-07-20 MED ORDER — PROMETHAZINE HCL 25 MG/ML IJ SOLN: 6.2500 mg | INTRAMUSCULAR | Status: DC | PRN

## 2013-07-20 MED ORDER — SODIUM CHLORIDE 0.9 % IV SOLN
100.0000 mL/h | INTRAVENOUS | Status: DC
  Administered 2013-07-20 – 2013-07-21 (×2): 100 mL/h via INTRAVENOUS
  Filled 2013-07-20 (×4): qty 1000

## 2013-07-20 MED ORDER — HYOSCYAMINE SULFATE 0.125 MG SL SUBL
0.1250 mg | SUBLINGUAL_TABLET | SUBLINGUAL | Status: DC | PRN
  Filled 2013-07-20: qty 1

## 2013-07-20 MED ORDER — CLINDAMYCIN PHOSPHATE 600 MG/50ML IV SOLN: 600.0000 mg | Freq: Four times a day (QID) | INTRAVENOUS | Status: DC

## 2013-07-20 MED ORDER — CLINDAMYCIN PHOSPHATE 900 MG/50ML IV SOLN
900.0000 mg | INTRAVENOUS | Status: DC
  Filled 2013-07-20: qty 50

## 2013-07-20 MED ORDER — ONDANSETRON HCL 4 MG/2ML IJ SOLN: 4.0000 mg | Freq: Four times a day (QID) | INTRAMUSCULAR | Status: DC | PRN

## 2013-07-20 MED ORDER — ACETAMINOPHEN 650 MG RE SUPP: 650.0000 mg | Freq: Four times a day (QID) | RECTAL | Status: DC

## 2013-07-20 MED ORDER — SCOPOLAMINE 1 MG/3DAYS TD PT72
MEDICATED_PATCH | TRANSDERMAL | Status: AC
  Filled 2013-07-20: qty 1

## 2013-07-20 MED ORDER — ACETAMINOPHEN 500 MG PO TABS
ORAL_TABLET | ORAL | Status: AC
  Filled 2013-07-20: qty 2

## 2013-07-20 MED ORDER — METOCLOPRAMIDE HCL 5 MG/ML IJ SOLN: 5.0000 mg | Freq: Three times a day (TID) | INTRAMUSCULAR | Status: DC | PRN

## 2013-07-20 SURGICAL SUPPLY — 62 items
ADH SKN CLS APL DERMABOND .7 (GAUZE/BANDAGES/DRESSINGS) ×1
BAG SPEC THK2 15X12 ZIP CLS (MISCELLANEOUS) ×1
BAG ZIPLOCK 12X15 (MISCELLANEOUS) ×2 IMPLANT
BLADE SAW SGTL 18X1.27X75 (BLADE) ×2 IMPLANT
BRUSH FEMORAL CANAL (MISCELLANEOUS) IMPLANT
CLOTH BEACON ORANGE TIMEOUT ST (SAFETY) ×2 IMPLANT
DERMABOND ADVANCED (GAUZE/BANDAGES/DRESSINGS) ×1
DERMABOND ADVANCED .7 DNX12 (GAUZE/BANDAGES/DRESSINGS) ×1 IMPLANT
DRAPE INCISE IOBAN 85X60 (DRAPES) ×2 IMPLANT
DRAPE ORTHO SPLIT 77X108 STRL (DRAPES) ×4
DRAPE POUCH INSTRU U-SHP 10X18 (DRAPES) ×2 IMPLANT
DRAPE SURG 17X11 SM STRL (DRAPES) ×2 IMPLANT
DRAPE SURG ORHT 6 SPLT 77X108 (DRAPES) ×2 IMPLANT
DRAPE U-SHAPE 47X51 STRL (DRAPES) ×2 IMPLANT
DRSG AQUACEL AG ADV 3.5X10 (GAUZE/BANDAGES/DRESSINGS) ×1 IMPLANT
DRSG AQUACEL AG ADV 3.5X14 (GAUZE/BANDAGES/DRESSINGS) ×1 IMPLANT
DRSG EMULSION OIL 3X16 NADH (GAUZE/BANDAGES/DRESSINGS) ×1 IMPLANT
DRSG MEPILEX BORDER 4X4 (GAUZE/BANDAGES/DRESSINGS) ×1 IMPLANT
DRSG MEPILEX BORDER 4X8 (GAUZE/BANDAGES/DRESSINGS) ×1 IMPLANT
DRSG TEGADERM 4X4.75 (GAUZE/BANDAGES/DRESSINGS) ×2 IMPLANT
DURAPREP 26ML APPLICATOR (WOUND CARE) ×2 IMPLANT
ELECT BLADE TIP CTD 4 INCH (ELECTRODE) ×2 IMPLANT
ELECT REM PT RETURN 9FT ADLT (ELECTROSURGICAL) ×2
ELECTRODE REM PT RTRN 9FT ADLT (ELECTROSURGICAL) ×1 IMPLANT
ELIMINATOR HOLE APEX DEPUY (Hips) ×1 IMPLANT
EVACUATOR 1/8 PVC DRAIN (DRAIN) ×2 IMPLANT
FACESHIELD LNG OPTICON STERILE (SAFETY) ×8 IMPLANT
GAUZE SPONGE 2X2 8PLY STRL LF (GAUZE/BANDAGES/DRESSINGS) ×1 IMPLANT
GLOVE BIOGEL PI IND STRL 7.5 (GLOVE) ×1 IMPLANT
GLOVE BIOGEL PI IND STRL 8 (GLOVE) ×1 IMPLANT
GLOVE BIOGEL PI INDICATOR 7.5 (GLOVE) ×1
GLOVE BIOGEL PI INDICATOR 8 (GLOVE) ×1
GLOVE ECLIPSE 8.0 STRL XLNG CF (GLOVE) ×4 IMPLANT
GOWN BRE IMP PREV XXLGXLNG (GOWN DISPOSABLE) ×4 IMPLANT
GOWN STRL NON-REIN LRG LVL3 (GOWN DISPOSABLE) ×2 IMPLANT
GRAFT DBM PARTICULATE CANC 10 (Bone Implant) IMPLANT
GRAFT IC CHAMBER MED (Bone Implant) ×2 IMPLANT
HANDPIECE INTERPULSE COAX TIP (DISPOSABLE)
HEAD CERAMIC 36 PLUS5 (Hips) ×1 IMPLANT
KIT BASIN OR (CUSTOM PROCEDURE TRAY) ×2 IMPLANT
LINER NEUTRAL 52X36MM PLUS 4 (Liner) ×1 IMPLANT
MANIFOLD NEPTUNE II (INSTRUMENTS) ×2 IMPLANT
NS IRRIG 1000ML POUR BTL (IV SOLUTION) ×4 IMPLANT
PACK TOTAL JOINT (CUSTOM PROCEDURE TRAY) ×2 IMPLANT
PIN SECTOR W/GRIP ACE CUP 52MM (Hips) ×1 IMPLANT
POSITIONER SURGICAL ARM (MISCELLANEOUS) ×2 IMPLANT
PRESSURIZER FEMORAL UNIV (MISCELLANEOUS) IMPLANT
SCREW 6.5MMX30MM (Screw) ×2 IMPLANT
SET HNDPC FAN SPRY TIP SCT (DISPOSABLE) IMPLANT
SPONGE GAUZE 2X2 STER 10/PKG (GAUZE/BANDAGES/DRESSINGS) ×1
SPONGE LAP 18X18 X RAY DECT (DISPOSABLE) ×2 IMPLANT
SPONGE LAP 4X18 X RAY DECT (DISPOSABLE) ×2 IMPLANT
STAPLER VISISTAT 35W (STAPLE) ×2 IMPLANT
SUCTION FRAZIER TIP 10 FR DISP (SUCTIONS) ×2 IMPLANT
SUT VIC AB 1 CT1 36 (SUTURE) ×6 IMPLANT
SUT VIC AB 2-0 CT1 27 (SUTURE) ×6
SUT VIC AB 2-0 CT1 TAPERPNT 27 (SUTURE) ×3 IMPLANT
SUT VLOC 180 0 24IN GS25 (SUTURE) ×4 IMPLANT
TOWEL OR 17X26 10 PK STRL BLUE (TOWEL DISPOSABLE) ×4 IMPLANT
TOWER CARTRIDGE SMART MIX (DISPOSABLE) IMPLANT
TRAY FOLEY CATH 14FRSI W/METER (CATHETERS) ×2 IMPLANT
WATER STERILE IRR 1500ML POUR (IV SOLUTION) ×2 IMPLANT

## 2013-07-20 NOTE — Anesthesia Postprocedure Evaluation (Signed)
Anesthesia Post Note  Patient: Kayla Sanford  Procedure(s) Performed: Procedure(s) (LRB): RIGHT TOTAL HIP REVISION (Right)  Anesthesia type: Spinal  Patient location: PACU  Post pain: Pain level controlled  Post assessment: Post-op Vital signs reviewed  Last Vitals: BP 111/69  Pulse 53  Temp(Src) 34.9 C (Oral)  Resp 17  SpO2 100%  Post vital signs: Reviewed  Level of consciousness: sedated  Complications: No apparent anesthesia complications

## 2013-07-20 NOTE — Transfer of Care (Signed)
Immediate Anesthesia Transfer of Care Note  Patient: Kayla Sanford  Procedure(s) Performed: Procedure(s): RIGHT TOTAL HIP REVISION (Right)  Patient Location: PACU  Anesthesia Type:Spinal  Level of Consciousness: awake, alert  and oriented  Airway & Oxygen Therapy: Patient Spontanous Breathing  Post-op Assessment: Report given to PACU RN and Post -op Vital signs reviewed and stable  Post vital signs: Reviewed and stable  Complications: No apparent anesthesia complications

## 2013-07-20 NOTE — Evaluation (Addendum)
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 Has had L revised also.  Clinical Impression  Pt ambulated x 50'. Mildly dizzy.Pt's bed/bath on second level. Plans Dc after PT tomorrow.Continue PT while in acute care.    PT Assessment  Patient needs continued PT services    Follow Up Recommendations  Home health PT    Does the patient have the potential to tolerate intense rehabilitation      Barriers to Discharge        Equipment Recommendations  None recommended by PT    Recommendations for Other Services     Frequency 7X/week    Precautions / Restrictions Precautions Precautions: Posterior Hip Restrictions Weight Bearing Restrictions: No RLE Weight Bearing: Weight bearing as tolerated   Pertinent Vitals/Pain Pain is , 3, only took xtra strength Tylenol, ice      Mobility  Bed Mobility Bed Mobility: Supine to Sit;Sitting - Scoot to Edge of Bed Supine to Sit: 4: Min assist Sitting - Scoot to [place] of Bed: 4: Min guard Details for Bed Mobility Assistance: cues for posterior precautions Transfers Transfers: Sit to Stand;Stand to Sit Sit to Stand: 4: Min guard;With upper extremity assist;From bed Stand to Sit: To chair/3-in-1;With armrests;With upper extremity assist;4: Min guard Details for Transfer Assistance: cues for posterior precautions, sequence Ambulation/Gait Ambulation/Gait Assistance: 1: +2 Total assist (pt a little dizzy) Ambulation/Gait: Patient Percentage: 80% Ambulation Distance (Feet): 50 Feet Assistive device: Rolling walker Gait Pattern: Step-to pattern;Decreased step length - right;Decreased stance time - right    Exercises     PT Diagnosis: Difficulty walking  PT Problem List: Decreased strength;Decreased  range of motion;Decreased activity tolerance;Decreased mobility;Decreased knowledge of precautions;Cardiopulmonary status limiting activity;Decreased safety awareness;Decreased knowledge of use of DME PT Treatment Interventions: DME instruction;Gait training;Stair training;Functional mobility training;Therapeutic activities;Therapeutic exercise;Patient/family education     PT Goals(Current goals can be found in the care plan section) Acute Rehab PT Goals Patient Stated Goal: I want to get this surgery over and get along. 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 Has had L revised also.       Prior Functioning  Home Living Family/patient expects to be discharged to:: Private residence Living Arrangements: Spouse/significant other Available Help at Discharge: Family Type of Home: House Home Access: Stairs to enter Secretary/administrator of Steps: 3 Entrance Stairs-Rails: Left;Right Home Layout: Two level;Bed/bath upstairs Alternate Level Stairs-Number of Steps: about 12 with landing Alternate Level Stairs-Rails: Right Home Equipment: Walker - 2 wheels;Cane - single point;Crutches Prior Function Level of Independence: Independent Communication Communication: No difficulties    Cognition  Cognition Arousal/Alertness: Awake/alert Behavior During Therapy: WFL for tasks assessed/performed Overall Cognitive Status: Within Functional Limits for tasks assessed    Extremity/Trunk Assessment Upper Extremity Assessment Upper Extremity Assessment: Overall WFL for tasks assessed Lower Extremity Assessment Lower Extremity Assessment: RLE deficits/detail RLE Deficits / Details: able to move leg to edge of bed, advances Leg    Balance    End of Session PT - End of Session Activity  Tolerance: Patient tolerated treatment well Patient left: in chair;with call bell/phone within reach Nurse Communication: Mobility status  GP  Rada Hay 07/20/2013, 5:40 PM  Blanchard Kelch PT [phone]

## 2013-07-20 NOTE — Brief Op Note (Signed)
07/20/2013  9:05 AM  PATIENT:  Lahoma Crocker  62 y.o. 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  PROCEDURE:  Procedure(s): RIGHT TOTAL HIP REVISION (Right)   Depuy, 52 Gription Pinnacle shell, 36+4 neutral Altrx liner, 36+5 Delta ceramic liner SURGEON:  Surgeon(s) and Role:    * Shelda Pal, MD - Primary  PHYSICIAN ASSISTANT: Lanney Gins  ANESTHESIA:   spinal  EBL:     BLOOD ADMINISTERED:none  DRAINS: (1 medium) Hemovact drain(s) in the right hip with  Suction Open   LOCAL MEDICATIONS USED:  NONE  SPECIMEN:  Source of Specimen:  right hip components explanted  DISPOSITION OF SPECIMEN:  PATHOLOGY  COUNTS:  YES  TOURNIQUET:  * No tourniquets in log *  DICTATION: .Other Dictation: Dictation Number [id number]  PLAN OF CARE: Admit to inpatient   PATIENT DISPOSITION:  PACU - hemodynamically stable.   Delay start of Pharmacological VTE agent (>24hrs) due to surgical blood loss or risk of bleeding: no

## 2013-07-20 NOTE — Interval H&P Note (Signed)
History and Physical Interval Note:  07/20/2013 7:09 AM  Kayla Sanford  has presented today for surgery, with the diagnosis of FAILED RIGHT TOTAL HIP ARTHROPLASTY  The various methods of treatment have been discussed with the patient and family. After consideration of risks, benefits and other options for treatment, the patient has consented to  Procedure(s): RIGHT TOTAL HIP REVISION (Right) as a surgical intervention .  The patient's history has been reviewed, patient examined, no change in status, stable for surgery.  I have reviewed the patient's chart and labs.  Questions were answered to the patient's satisfaction.     Shelda Pal

## 2013-07-20 NOTE — Preoperative (Signed)
Beta Blockers   Reason not to administer Beta Blockers:Not Applicable 

## 2013-07-20 NOTE — Anesthesia Preprocedure Evaluation (Addendum)
Anesthesia Evaluation  Patient identified by MRN, date of birth, ID band Patient awake    Reviewed: Allergy & Precautions, H&P , NPO status , Patient's Chart, lab work & pertinent test results  History of Anesthesia Complications (+) PONV  Airway Mallampati: II TM Distance: >3 FB Neck ROM: Full    Dental  (+) Dental Advisory Given and Teeth Intact   Pulmonary former smoker,  breath sounds clear to auscultation        Cardiovascular hypertension, Pt. on medications Rhythm:Regular Rate:Normal     Neuro/Psych  Headaches, negative psych ROS   GI/Hepatic negative GI ROS, Neg liver ROS,   Endo/Other  negative endocrine ROS  Renal/GU negative Renal ROS     Musculoskeletal negative musculoskeletal ROS (+)   Abdominal   Peds  Hematology negative hematology ROS (+)   Anesthesia Other Findings   Reproductive/Obstetrics negative OB ROS (+) Breast feeding                           Anesthesia Physical Anesthesia Plan  ASA: II  Anesthesia Plan: Spinal   Post-op Pain Management:    Induction: Intravenous  Airway Management Planned:   Additional Equipment:   Intra-op Plan:   Post-operative Plan:   Informed Consent: I have reviewed the patients History and Physical, chart, labs and discussed the procedure including the risks, benefits and alternatives for the proposed anesthesia with the patient or authorized representative who has indicated his/her understanding and acceptance.   Dental advisory given  Plan Discussed with: CRNA  Anesthesia Plan Comments:        Anesthesia Quick Evaluation

## 2013-07-20 NOTE — Anesthesia Procedure Notes (Signed)
Spinal  Patient location during procedure: OR Start time: 07/20/2013 7:23 AM End time: 07/20/2013 7:28 AM Staffing Anesthesiologist: Lewie Loron R Performed by: anesthesiologist  Preanesthetic Checklist Completed: patient identified, site marked, surgical consent, pre-op evaluation, timeout performed, IV checked, risks and benefits discussed and monitors and equipment checked Spinal Block Patient position: sitting Prep: ChloraPrep Patient monitoring: heart rate, continuous pulse ox and blood pressure Approach: midline Location: L3-4 Injection technique: single-shot Needle Needle type: Sprotte  Needle gauge: 24 G Needle length: 9 cm Assessment Sensory level: T8 Additional Notes Expiration date of kit checked and confirmed. Patient tolerated procedure well, without complications.

## 2013-07-20 NOTE — Progress Notes (Signed)
Utilization review completed.  

## 2013-07-21 ENCOUNTER — Encounter (HOSPITAL_COMMUNITY): Payer: Self-pay | Admitting: Orthopedic Surgery

## 2013-07-21 DIAGNOSIS — D500 Iron deficiency anemia secondary to blood loss (chronic): Secondary | ICD-10-CM

## 2013-07-21 LAB — CBC
HCT: 30.7 % — ABNORMAL LOW (ref 36.0–46.0)
MCV: 89.8 fL (ref 78.0–100.0)
RDW: 13.6 % (ref 11.5–15.5)
WBC: 9.5 10*3/uL (ref 4.0–10.5)

## 2013-07-21 LAB — BASIC METABOLIC PANEL
BUN: 13 mg/dL (ref 6–23)
Chloride: 106 mEq/L (ref 96–112)
Creatinine, Ser: 0.81 mg/dL (ref 0.50–1.10)
GFR calc Af Amer: 89 mL/min — ABNORMAL LOW (ref >90)

## 2013-07-21 MED ORDER — POLYETHYLENE GLYCOL 3350 17 G PO PACK: 17.0000 g | PACK | Freq: Two times a day (BID) | ORAL | Status: DC | PRN

## 2013-07-21 MED ORDER — DSS 100 MG PO CAPS: 100.0000 mg | ORAL_CAPSULE | Freq: Two times a day (BID) | ORAL | Status: DC | PRN

## 2013-07-21 MED ORDER — METHOCARBAMOL 500 MG PO TABS: 500.0000 mg | ORAL_TABLET | Freq: Four times a day (QID) | ORAL | Status: DC | PRN

## 2013-07-21 MED ORDER — RIVAROXABAN 10 MG PO TABS: 10.0000 mg | ORAL_TABLET | Freq: Every day | ORAL | Status: DC

## 2013-07-21 MED ORDER — FERROUS SULFATE 325 (65 FE) MG PO TABS: 325.0000 mg | ORAL_TABLET | Freq: Three times a day (TID) | ORAL | Status: DC

## 2013-07-21 NOTE — Op Note (Signed)
NAMEMarland Kitchen  Kayla Sanford, NOP NO.:  [id number]  MEDICAL RECORD NO.:  [id number]  LOCATION:  1616                         FACILITY:  [facility]  PHYSICIAN:  [name]. [name], [name]  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  PROCEDURE:  Revision of right total hip replacement with use of allograft and autograft bone grafting.  COMPONENTS USED:  DePuy Gription Pinnacle shell 52 mm with 36+ 4 neutral AltrX liner, 36+ 5 Delta ceramic ball, placed on the trunnion.  SURGEON:  [name]. [name], [name]  ASSISTANT:  Lanney Gins, PA-C.  Note that Ms. Babish was present for the entirety of the case from preoperative position, perioperative management of the operative extremity, general facilitation of the case, and primary wound closure.  ANESTHESIA:  Spinal.  SPECIMENS:  The patient's explant acetabular shell and femoral head were removed and sent to Pathology for routine specimen evaluation due to the litigation pending on this implant.  DRAINS:  One medium Hemovac.  BLOOD LOSS:  About 200 mL.  COMPLICATIONS:  None apparent.  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  She has already had her left hip revised due to increasing pain and concern with excellent results.  She is at this point ready to proceed with right hip revision due to increasing pain and workup consistent with fluid collection, current concerns, as well as metal ion level increases.  Based on the risks  and benefits that we had previously discussed as well as the relief she has had in the left hip, she is ready to proceed.  Consent was obtained.  After reviewing specific risk of increased risk of dislocation in the revision setting, infection, DVT, need for future revision surgery for the benefit of pain relief.  PROCEDURE IN DETAIL:  The patient was brought to the operative theater. Once adequate anesthesia preoperative antibiotics, clindamycin administered due to penicillin allergy.  She was positioned into the left lateral decubitus position with the right side up.  The right lower extremity was then prepped and draped in sterile fashion.  A time-out was performed identifying the patient, planned procedure, and extremity. At this point, they had previously demarcated her old incision, excised the old scar.  Soft tissue dissection was carried down to the gluteal fascia and iliotibial band.  This was then split posteriorly exposing the posterior aspect of the joint.  With this, I identified that she had posterior capsular swelling and when we incised the capsular layer, there was a large rush of synovial fluid.  No signs of infection. Following initial dissection exposure, this included a posterior synovectomy removing some of the metal stained synovium from the posterior capsule.  I exposed the posterior one-third to one-half of the hip and  dislocated the femoral head and removed the femoral head again sending this to Pathology.  With the hip ball removed, I was able to further debride the capsule and synovium around the anterior aspect of the hip joint.  I then elevated the soft tissues, capsule, and gluteus minimus off the ilium and then placed the exposed trunnion onto the ilium.  With the hip exposed at this point, I was able to use the Innomed Explant system matching the 48 cup.  We removed the old cup without significant bone loss.  Following removal of the cup, I was able to  remove some metal-stained tissues within the base of the cup identifying some cystic formation in the anterior pubic region as well as posterior ischial region.  I debrided this back with a curette.  I then began reaming with a 49 reamer, then a 51 mm reamer, and felt that I would select a 52 cup using Gription cup due to the revision setting.  I selected 10 mL of allograft bone chips and mixed this with reamings and then packed this into the ischium and pubic rami region.  I then reverse reamed with a 50 mm reamer.  I then impacted the 52 mm cup at approximately 35-40 degrees of abduction and 20 degrees of forward flexion was confirmed using the hip guide.  I placed 2 cancellous screws into the ilium.  A hole eliminator was placed and the final 36+ 4 neutral liner was impacted.  I then did trial reductions, and selected a 36+5 ball to improve some of this initial stability predominantly due to the dissection required for synovectomy.  Combined anteversion was 45-50 degrees.  There was no evidence of impingement with range of motion including forward flexion to 90 degrees internal rotation, as well as in the sleep position.  Given all these findings, the trial head ball was removed and the final 36+ 5 Delta ceramic ball was then impacted onto a freshly prepared trunnion by using the Bovie pad to remove some of the identified corrosion wear on the trunnion.  I then irrigated out thoroughly prior to impacting the ceramic ball, and then the hip was reduced.  There was minimal bleeding, but I did place a Hemovac drain deep.  There was no real posterior capsule for me to reapproximate.  I then thus reapproximated the iliotibial band and gluteal fascia with a combination of #1 Vicryl and 0 V-Loc.  The remainder of wound was closed with 2-0 Vicryl and running 4-0 Monocryl.  The hip was then cleaned, dried, and dressed sterilely using Dermabond and Aquacel dressing.  Drain site dressed  separately.  She was then brought to the recovery room in a stable condition tolerating the procedure well.  Findings reviewed with husband, again acetabular component and femoral head was sent to Pathology for review for litigation purposes.     [name] [name], [name]     MDO/MEDQ  D:  07/20/2013  T:  07/21/2013  Job:  [id number]

## 2013-07-21 NOTE — Progress Notes (Signed)
OT Cancellation Note  Patient Details Name: Kayla Sanford MRN: [id number] DOB: 1951/07/14   Cancelled Treatment:     Pt screened by PT.  No OT needs identified.  Pt has had revision previously and understands hip precautions Malique Driskill 07/21/2013, 7:33 AM Marica Otter, OTR/L [phone] 07/21/2013

## 2013-07-21 NOTE — Progress Notes (Addendum)
HHPT services have been set up with [facility].  Aliannah Holstrom RN BSN  [phone] 

## 2013-07-21 NOTE — Progress Notes (Signed)
Patient ID: Kayla Sanford, female   DOB: Jan 04, 1951, 62 y.o.   MRN: [id number] Subjective: 1 Day Post-Op Procedure(s) (LRB): RIGHT TOTAL HIP REVISION (Right)    Patient reports pain as mild.  Doing well, no major complaints, walked yesterday  Objective:   VITALS:   Filed Vitals:   07/21/13 0555  BP: 113/70  Pulse: 57  Temp: 98.5 F (36.9 C)  Resp: 16    Neurovascular intact Incision: dressing C/D/I H/V drain removed   LABS  Recent Labs  07/21/13 0431  HGB 10.1*  HCT 30.7*  WBC 9.5  PLT 194     Recent Labs  07/21/13 0431  NA 138  K 3.9  BUN 13  CREATININE 0.81  GLUCOSE 109*    No results found for this basename: LABPT, INR,  in the last 72 hours   Assessment/Plan: 1 Day Post-Op Procedure(s) (LRB): RIGHT TOTAL HIP REVISION (Right)   Up with therapy Discharge home with home health today after therapy

## 2013-07-21 NOTE — Progress Notes (Signed)
Physical Therapy Treatment Patient Details Name: Kayla Sanford MRN: [id number] DOB: 1951-11-23 Today's Date: 07/21/2013 Time: 4098-1191 PT Time Calculation (min): 29 min  PT Assessment / Plan / Recommendation  History of Present Illness     Clinical Impression Pt tolerated mobility very wall. No [pain, not antalgic.    PT Comments   Walk once more before Dc.  Follow Up Recommendations  Home health PT     Does the patient have the potential to tolerate intense rehabilitation     Barriers to Discharge        Equipment Recommendations  None recommended by PT    Recommendations for Other Services    Frequency 7X/week   Progress towards PT Goals Progress towards PT goals: Progressing toward goals  Plan Current plan remains appropriate    Precautions / Restrictions Precautions Precautions: Posterior Hip   Pertinent Vitals/Pain none    Mobility  Bed Mobility Bed Mobility: Sit to Supine Supine to Sit: 6: Modified independent (Device/Increase time) Sitting - Scoot to Edge of Bed: 6: Modified independent (Device/Increase time) Sit to Supine: 6: Modified independent (Device/Increase time) Transfers Sit to Stand: 5: Supervision;From bed Stand to Sit: 5: Supervision;To bed Details for Transfer Assistance: cues for posterior precautions, sequence Ambulation/Gait Ambulation/Gait Assistance: 5: Supervision Ambulation Distance (Feet): 300 Feet Assistive device: Rolling walker Ambulation/Gait Assistance Details: cues to not walk away fro RW, cues for turns. Gait Pattern: Step-through pattern Stairs: Yes Stairs Assistance: 5: Supervision Stairs Assistance Details (indicate cue type and reason): cues for sequence. Stair Management Technique: One rail Right;One rail Left;Step to pattern;Forwards;With crutches Number of Stairs: 9    Exercises Total Joint Exercises Quad Sets: AROM;Right;10 reps Short Arc Quad: AROM;Right;10 reps Heel Slides: AROM;Right;10 reps Hip  ABduction/ADduction: AROM;Right;10 reps   PT Diagnosis:    PT Problem List:   PT Treatment Interventions:     PT Goals (current goals can now be found in the care plan section)    Visit Information  Last PT Received On: 07/21/13 Assistance Needed: +1    Subjective Data      Cognition  Cognition Arousal/Alertness: Awake/alert    Balance     End of Session PT - End of Session Activity Tolerance: Patient tolerated treatment well Patient left: in bed;with call bell/phone within reach Nurse Communication: Mobility status   GP     Rada Hay 07/21/2013, 12:16 PM

## 2013-07-21 NOTE — Progress Notes (Signed)
   Subjective: 1 Day Post-Op Procedure(s) (LRB): RIGHT TOTAL HIP REVISION (Right)   Patient reports pain as mild, pain well controlled. No events throughout the night. Ready to be discharged home.  Objective:   VITALS:   Filed Vitals:   07/21/13 0555  BP: 113/70  Pulse: 57  Temp: 98.5 F (36.9 C)  Resp: 16    Neurovascular intact Dorsiflexion/Plantar flexion intact Incision: dressing C/D/I No cellulitis present Compartment soft  LABS  Recent Labs  07/21/13 0431  HGB 10.1*  HCT 30.7*  WBC 9.5  PLT 194     Recent Labs  07/21/13 0431  NA 138  K 3.9  BUN 13  CREATININE 0.81  GLUCOSE 109*     Assessment/Plan: 1 Day Post-Op Procedure(s) (LRB): RIGHT TOTAL HIP REVISION (Right) HV drain d/c'ed Foley cath d/c'ed Advance diet Up with therapy D/C IV fluids Discharge home with home health Follow up in 2 weeks at [facility]. Follow up with OLIN,Roann Merk D in 2 weeks.  Contact information:  [facility] [address] [place] [address] [phone]    Expected ABLA  Treated with iron and will observe     Anastasio Auerbach. Saleah Rishel   PAC  07/21/2013, 7:34 AM

## 2013-07-21 NOTE — Progress Notes (Signed)
Physical Therapy Treatment Patient Details Name: Kayla Sanford MRN: [id number] DOB: 29-Dec-1951 Today's Date: 07/21/2013 Time: 4098-1191 PT Time Calculation (min): 9 min  PT Assessment / Plan / Recommendation  History of Present Illness        PT Comments   Pt is ready for DC.  Follow Up Recommendations  Home health PT     Does the patient have the potential to tolerate intense rehabilitation     Barriers to Discharge        Equipment Recommendations  None recommended by PT    Recommendations for Other Services    Frequency 7X/week   Progress towards PT Goals Progress towards PT goals: Progressing toward goals  Plan Current plan remains appropriate    Precautions / Restrictions Precautions Precautions: Posterior Hip   Pertinent Vitals/Pain No pain    Mobility  Bed Mobility Bed Mobility: Sit to Supine Supine to Sit: 6: Modified independent (Device/Increase time) Sitting - Scoot to Edge of Bed: 6: Modified independent (Device/Increase time)  Transfers Sit to Stand: 5: Supervision;From bed Stand to Sit: 5: Supervision Details for Transfer Assistance: cues for posterior precautions, sequence Ambulation/Gait Ambulation/Gait Assistance: 5: Supervision Ambulation Distance (Feet): 300 Feet Assistive device: Rolling walker Ambulation/Gait Assistance Details: cues to not walk away fro RW, cues for turns. Gait Pattern: Step-through pattern Stairs: Yes Stairs Assistance: 5: Supervision   Exercises    PT Diagnosis:    PT Problem List:   PT Treatment Interventions:     PT Goals (current goals can now be found in the care plan section)    Visit Information  Last PT Received On: 07/21/13 Assistance Needed: +1    Subjective Data      Cognition  Cognition Arousal/Alertness: Awake/alert    Balance     End of Session PT - End of Session Activity Tolerance: Patient tolerated treatment well Patient left: with family/visitor present Nurse Communication:  Mobility status   GP     Kayla Sanford 07/21/2013, 12:19 PM

## 2013-07-21 NOTE — Discharge Summary (Signed)
Physician Discharge Summary  Patient ID: Kayla Sanford MRN: [id number] DOB/AGE: 08-22-1951 62 y.o.  Admit date: 07/20/2013 Discharge date: 07/21/2013   Procedures:  Procedure(s) (LRB): RIGHT TOTAL HIP REVISION (Right)  Attending Physician:  Dr. Durene Romans   Admission Diagnoses:   Right hip OA / pain  Discharge Diagnoses:  Principal Problem:   S/P right TH revision Active Problems:   Expected blood loss anemia  Past Medical History  Diagnosis Date  . Hypertension   . Arthritis   . Constipation, chronic   . IBS (irritable bowel syndrome)   . Headache(784.0)   . PONV (postoperative nausea and vomiting)     severe headache from medicine, recently had patch which helped. tylenol after surgery    HPI: Kayla Sanford, 62 y.o. 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 Patient has failed non-surgical conservative treatments for greater than 12 weeks to include NSAID's and/or analgesics, supervised PT with diminished ADL's post treatment, use of assistive devices and activity modification. The indications for the revision total hip arthroplasty are bearing surface wear leading to symptomatic synovitis. Onset of symptoms was gradual starting 6 years ago with gradually worsening course since that time. Prior procedures on the right hip include arthroplasty,per Dr. [name] in 2008. Patient currently rates pain in the right hip at 7 out of 10 with activity. There is worsening of pain with activity and weight bearing, trendelenberg gait, pain that interfers with activities of daily living, pain with passive range of motion and getting up from a seated position. 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 This condition presents safety issues increasing the risk of falls. This patient has had evidence of elevate lab work in the clinic. There is no current  active signs of infection. Risks, benefits and expectations were discussed with the patient. Patient understand the risks, benefits and expectations and wishes to proceed with surgery.   PCP: Marga Melnick, MD   Discharged Condition: good  Hospital Course:  Patient underwent the above stated procedure on 07/20/2013. Patient tolerated the procedure well and brought to the recovery room in good condition and subsequently to the floor.  POD #1 BP: 113/70 ; Pulse: 57 ; Temp: 98.5 F (36.9 C) ; Resp: 16  Pt's foley was removed, as well as the hemovac drain removed. IV was changed to a saline lock. Patient reports pain as mild, pain well controlled. No events throughout the night. Ready to be discharged home. Neurovascular intact, dorsiflexion/plantar flexion intact, incision: dressing C/D/I, no cellulitis present and compartment soft.   LABS  Basename    HGB  10.1  HCT  30.7    Discharge Exam: General appearance: alert, cooperative and no distress Extremities: Homans sign is negative, no sign of DVT, no edema, redness or tenderness in the calves or thighs and no ulcers, gangrene or trophic changes  Disposition:    Home or Self Care with follow up in 2 weeks   Follow-up Information   Follow up with Shelda Pal, MD. Schedule an appointment as soon as possible for a visit in 2 weeks.   Contact information:   [address] [place] [address] [phone]       Discharge Orders   Future Orders Complete By Expires     Call MD / Call 911  As directed     Comments:      If you experience chest  pain or shortness of breath, CALL 911 and be transported to the hospital emergency room.  If you develope a fever above 101 F, pus (white drainage) or increased drainage or redness at the wound, or calf pain, call your surgeon's office.    Change dressing  As directed     Comments:      Maintain surgical dressing for 10-14 days, then replace with 4x4 guaze and tape. Keep  the area dry and clean.    Constipation Prevention  As directed     Comments:      Drink plenty of fluids.  Prune juice may be helpful.  You may use a stool softener, such as Colace (over the counter) 100 mg twice a day.  Use MiraLax (over the counter) for constipation as needed.    Diet - low sodium heart healthy  As directed     Discharge instructions  As directed     Comments:      Maintain surgical dressing for 10-14 days, then replace with gauze and tape. Keep the area dry and clean until follow up. Follow up in 2 weeks at [facility]. Call with any questions or concerns.    Increase activity slowly as tolerated  As directed     TED hose  As directed     Comments:      Use stockings (TED hose) for 2 weeks on both leg(s).  You may remove them at night for sleeping.    Weight bearing as tolerated  As directed          Medication List         acetaminophen 500 MG tablet  Commonly known as:  TYLENOL  Take 1,000 mg by mouth every 6 (six) hours as needed for pain.     BIOTIN 5000 5 MG Caps  Generic drug:  Biotin  Take 1 capsule by mouth daily.     cholecalciferol 1000 UNITS tablet  Commonly known as:  VITAMIN D  Take 1,000 Units by mouth daily.     DSS 100 MG Caps  Take 100 mg by mouth 2 (two) times daily as needed for constipation.     ferrous sulfate 325 (65 FE) MG tablet  Take 1 tablet (325 mg total) by mouth 3 (three) times daily after meals.     Flax Seed Oil 1000 MG Caps  Take 1 capsule by mouth daily.     hyoscyamine 0.125 MG SL tablet  Commonly known as:  LEVSIN SL  Place 0.125 mg under the tongue every 4 (four) hours as needed for cramping.     methocarbamol 500 MG tablet  Commonly known as:  ROBAXIN  Take 1 tablet (500 mg total) by mouth every 6 (six) hours as needed.     metoprolol succinate 50 MG 24 hr tablet  Commonly known as:  TOPROL-XL  Take 50 mg by mouth every evening. Take with or immediately following a meal.     multivitamin with  minerals Tabs  Take 1 tablet by mouth daily.     polyethylene glycol packet  Commonly known as:  MIRALAX / GLYCOLAX  Take 17 g by mouth 2 (two) times daily as needed.     rivaroxaban 10 MG Tabs tablet  Commonly known as:  XARELTO  Take 1 tablet (10 mg total) by mouth daily.     saccharomyces boulardii 250 MG capsule  Commonly known as:  FLORASTOR  Take 250 mg by mouth 2 (two) times daily.  simethicone 125 MG chewable tablet  Commonly known as:  MYLICON  Chew 125 mg by mouth daily as needed for flatulence.     tretinoin 0.025 % cream  Commonly known as:  RETIN-A  Apply topically at bedtime.     vitamin C 1000 MG tablet  Take 1,000 mg by mouth daily.         Signed: Anastasio Auerbach. Vanassa Penniman   PAC  07/21/2013, 4:37 PM

## 2013-07-22 MED FILL — Clindamycin Phosphate in D5W IV Soln 900 MG/50ML: INTRAVENOUS | Qty: 50 | Status: AC

## 2013-07-22 MED FILL — Dexamethasone Sodium Phosphate Inj 10 MG/ML: INTRAMUSCULAR | Qty: 1 | Status: AC

## 2013-07-22 MED FILL — Midazolam HCl Inj 2 MG/2ML (Base Equivalent): INTRAMUSCULAR | Qty: 2 | Status: AC

## 2013-07-22 MED FILL — Ondansetron HCl Inj 4 MG/2ML (2 MG/ML): INTRAMUSCULAR | Qty: 2 | Status: AC

## 2013-07-22 MED FILL — Ephedrine Sulf-NaCl PF Pref Syr 50 MG/10ML-0.9% (5 MG/ML): INTRAVENOUS | Qty: 6 | Status: AC

## 2013-07-22 MED FILL — Propofol IV Emul 200 MG/20ML (10 MG/ML): INTRAVENOUS | Qty: 20 | Status: AC

## 2013-07-22 MED FILL — Tranexamic Acid Inj 100 MG/ML: INTRAVENOUS | Qty: 10 | Status: AC

## 2013-07-22 MED FILL — Fentanyl Citrate Inj 0.05 MG/ML: INTRAMUSCULAR | Qty: 1 | Status: AC

## 2013-11-05 ENCOUNTER — Other Ambulatory Visit: Payer: Self-pay

## 2014-01-19 ENCOUNTER — Other Ambulatory Visit: Payer: Self-pay

## 2014-01-19 DIAGNOSIS — Z1231 Encounter for screening mammogram for malignant neoplasm of breast: Secondary | ICD-10-CM

## 2014-03-25 ENCOUNTER — Ambulatory Visit
Admission: RE | Admit: 2014-03-25 | Discharge: 2014-03-25 | Disposition: A | Discharge status: Home / Self Care | Payer: Self-pay | Source: Ambulatory Visit

## 2014-03-25 DIAGNOSIS — Z1231 Encounter for screening mammogram for malignant neoplasm of breast: Secondary | ICD-10-CM

## 2014-05-04 ENCOUNTER — Other Ambulatory Visit: Payer: Self-pay | Admitting: Internal Medicine

## 2014-08-06 ENCOUNTER — Ambulatory Visit (INDEPENDENT_AMBULATORY_CARE_PROVIDER_SITE_OTHER): Payer: BC Managed Care – PPO | Admitting: Internal Medicine

## 2014-08-06 ENCOUNTER — Encounter: Payer: Self-pay | Admitting: Internal Medicine

## 2014-08-06 VITALS — BP 134/88 | HR 65 | Temp 98.0°F | Ht 64.0 in | Wt 124.5 lb

## 2014-08-06 DIAGNOSIS — E049 Nontoxic goiter, unspecified: Secondary | ICD-10-CM

## 2014-08-06 DIAGNOSIS — R7309 Other abnormal glucose: Secondary | ICD-10-CM | POA: Insufficient documentation

## 2014-08-06 DIAGNOSIS — Z0000 Encounter for general adult medical examination without abnormal findings: Secondary | ICD-10-CM

## 2014-08-06 DIAGNOSIS — I10 Essential (primary) hypertension: Secondary | ICD-10-CM

## 2014-08-06 MED ORDER — METOPROLOL SUCCINATE ER 50 MG PO TB24: ORAL_TABLET | ORAL | Status: DC

## 2014-08-06 NOTE — Progress Notes (Signed)
Subjective:    Patient ID: Kayla Sanford, female    DOB: 05/08/1951, 63 y.o.   MRN: [id number]  HPI  She is here for a physical;acute issues denied.  She is not monitoring her blood pressure at home. She has been compliant with medication  She has no adverse effects with the  BP medicine  She is on a low salt heart healthy diet  She will walk 30 minutes 3 times a week at lunch   Review of Systems  Chest pain, palpitations, tachycardia, exertional dyspnea, paroxysmal nocturnal dyspnea, claudication or edema are absent.  She has noted the sensation of hearing her heartbeat in her ear at night in the lateral decubitus position.       Objective:   Physical Exam  Pertinent or positive findings include: She is thin but appears well-nourished and healthy The contour of the thyroid slightly irregular without definitive nodule. S4 is noted with no significant murmur or gallops. The aorta is palpable; clinically there is no evidence of aneurysm She has slight crepitus with range of motion of the shoulders and knees. She has some discomfort with range of motion of the hips.  Gen.:  Alert, appropriate and cooperative throughout exam. Appears younger than stated age  Head: Normocephalic without obvious abnormalities  Eyes: No corneal or conjunctival inflammation noted. Pupils equal round reactive to light and accommodation. Extraocular motion intact. Ears: External  ear exam reveals no significant lesions or deformities. Canals clear .TMs normal. Hearing is grossly normal bilaterally. Nose: External nasal exam reveals no deformity or inflammation. Nasal mucosa are pink and moist. No lesions or exudates noted.   Mouth: Oral mucosa and oropharynx reveal no lesions or exudates. Teeth in good repair. Neck: No deformities, masses, or tenderness noted. Range of motion normal. Lungs: Normal respiratory effort; chest expands symmetrically. Lungs are clear to auscultation without rales,  wheezes, or increased work of breathing. Heart: Normal rate and rhythm. Normal S1 and S2. No gallop, click, or rub. No murmur. Abdomen: Bowel sounds normal; abdomen soft and nontender. No masses, organomegaly or hernias noted. Genitalia:  as per Gyn                                  Musculoskeletal/extremities: No deformity or scoliosis noted of  the thoracic or lumbar spine. . No clubbing, cyanosis, edema, or significant extremity  deformity noted. Range of motion normal .Tone & strength normal. Hand joints normal Fingernail health good. Able to lie down & sit up w/o help. Negative SLR bilaterally Vascular: Carotid, radial artery, dorsalis pedis and  posterior tibial pulses are full and equal. No bruits present. Neurologic: Alert and oriented x3. Deep tendon reflexes symmetrical and normal.  Gait normal .       Skin: Intact without suspicious lesions or rashes. Lymph: No cervical, axillary lymphadenopathy present. Psych: Mood and affect are normal. Normally interactive                                                                                        Assessment & Plan:  #1 comprehensive physical  exam; no acute findings  Plan: see Orders  & Recommendations

## 2014-08-06 NOTE — Patient Instructions (Signed)
Your next office appointment will be determined based upon review of your pending labs &/ thyroid US. Those instructions will be transmitted to you through My Chart Minimal Blood Pressure Goal= AVERAGE < 140/90;  Ideal is an AVERAGE < 135/85. This AVERAGE should be calculated from @ least 5-7 BP readings taken @ different times of day on different days of week. You should not respond to isolated BP readings , but rather the AVERAGE for that week .Please bring your  blood pressure cuff to office visits to verify that it is reliable.It  can also be checked against the blood pressure device at the pharmacy. Finger or wrist cuffs are not dependable; an arm cuff is.

## 2014-08-06 NOTE — Progress Notes (Signed)
Pre visit review using our clinic review tool, if applicable. No additional management support is needed unless otherwise documented below in the visit note. 

## 2014-08-09 ENCOUNTER — Telehealth: Payer: Self-pay | Admitting: Internal Medicine

## 2014-08-09 NOTE — Telephone Encounter (Signed)
Relevant patient education assigned to patient using Emmi. ° °

## 2014-08-13 ENCOUNTER — Encounter: Payer: Self-pay | Admitting: Internal Medicine

## 2014-08-13 ENCOUNTER — Other Ambulatory Visit (INDEPENDENT_AMBULATORY_CARE_PROVIDER_SITE_OTHER): Payer: BC Managed Care – PPO

## 2014-08-13 DIAGNOSIS — Z0000 Encounter for general adult medical examination without abnormal findings: Secondary | ICD-10-CM

## 2014-08-13 DIAGNOSIS — E785 Hyperlipidemia, unspecified: Secondary | ICD-10-CM | POA: Insufficient documentation

## 2014-08-13 LAB — CBC WITH DIFFERENTIAL/PLATELET
BASOS ABS: 0 10*3/uL (ref 0.0–0.1)
Basophils Relative: 1.1 % (ref 0.0–3.0)
EOS PCT: 2.1 % (ref 0.0–5.0)
Eosinophils Absolute: 0.1 10*3/uL (ref 0.0–0.7)
HCT: 38.1 % (ref 36.0–46.0)
Hemoglobin: 12.7 g/dL (ref 12.0–15.0)
LYMPHS PCT: 35.6 % (ref 12.0–46.0)
Lymphs Abs: 1.3 10*3/uL (ref 0.7–4.0)
MCHC: 33.4 g/dL (ref 30.0–36.0)
MCV: 92.4 fl (ref 78.0–100.0)
Monocytes Absolute: 0.3 10*3/uL (ref 0.1–1.0)
Monocytes Relative: 9.7 % (ref 3.0–12.0)
Neutro Abs: 1.9 10*3/uL (ref 1.4–7.7)
Neutrophils Relative %: 51.5 % (ref 43.0–77.0)
Platelets: 221 10*3/uL (ref 150.0–400.0)
RBC: 4.12 Mil/uL (ref 3.87–5.11)
RDW: 13.1 % (ref 11.5–15.5)
WBC: 3.6 10*3/uL — ABNORMAL LOW (ref 4.0–10.5)

## 2014-08-13 LAB — LIPID PANEL
Cholesterol: 195 mg/dL (ref 0–200)
HDL: 49.8 mg/dL (ref >39.00)
LDL CALC: 131 mg/dL — AB (ref 0–99)
NonHDL: 145.2
Total CHOL/HDL Ratio: 4
Triglycerides: 73 mg/dL (ref 0.0–149.0)
VLDL: 14.6 mg/dL (ref 0.0–40.0)

## 2014-08-13 LAB — HEPATIC FUNCTION PANEL
ALK PHOS: 60 U/L (ref 39–117)
ALT: 15 U/L (ref 0–35)
AST: 23 U/L (ref 0–37)
Albumin: 4 g/dL (ref 3.5–5.2)
BILIRUBIN TOTAL: 0.8 mg/dL (ref 0.2–1.2)
Bilirubin, Direct: 0.1 mg/dL (ref 0.0–0.3)
Total Protein: 6.6 g/dL (ref 6.0–8.3)

## 2014-08-13 LAB — BASIC METABOLIC PANEL
BUN: 19 mg/dL (ref 6–23)
CALCIUM: 9.8 mg/dL (ref 8.4–10.5)
CO2: 31 mEq/L (ref 19–32)
Chloride: 106 mEq/L (ref 96–112)
Creatinine, Ser: 0.9 mg/dL (ref 0.4–1.2)
GFR: 66.37 mL/min (ref >60.00)
GLUCOSE: 86 mg/dL (ref 70–99)
POTASSIUM: 4.2 meq/L (ref 3.5–5.1)
Sodium: 140 mEq/L (ref 135–145)

## 2014-08-13 LAB — TSH: TSH: 2.1 u[IU]/mL (ref 0.35–4.50)

## 2014-08-13 LAB — VITAMIN D 25 HYDROXY (VIT D DEFICIENCY, FRACTURES): VITD: 54.22 ng/mL (ref 30.00–100.00)

## 2014-08-20 ENCOUNTER — Ambulatory Visit
Admission: RE | Admit: 2014-08-20 | Discharge: 2014-08-20 | Disposition: A | Discharge status: Home / Self Care | Payer: BC Managed Care – PPO | Source: Ambulatory Visit | Attending: Internal Medicine | Admitting: Internal Medicine

## 2014-08-20 DIAGNOSIS — E049 Nontoxic goiter, unspecified: Secondary | ICD-10-CM

## 2014-08-29 IMAGING — US US SOFT TISSUE HEAD/NECK
1 series · 14 of 25 positions shown · non-contrast
Comparison: Prior thyroid ultrasound 03/10/2012

CLINICAL DATA: Thyroid goiter

EXAM:
THYROID ULTRASOUND
TECHNIQUE: Ultrasound examination of the thyroid gland and adjacent soft
tissues was performed.

[Series 1: us soft tissue head/neck · 0.09mm/px · 14 of 45 slices shown]
[im 1/45]
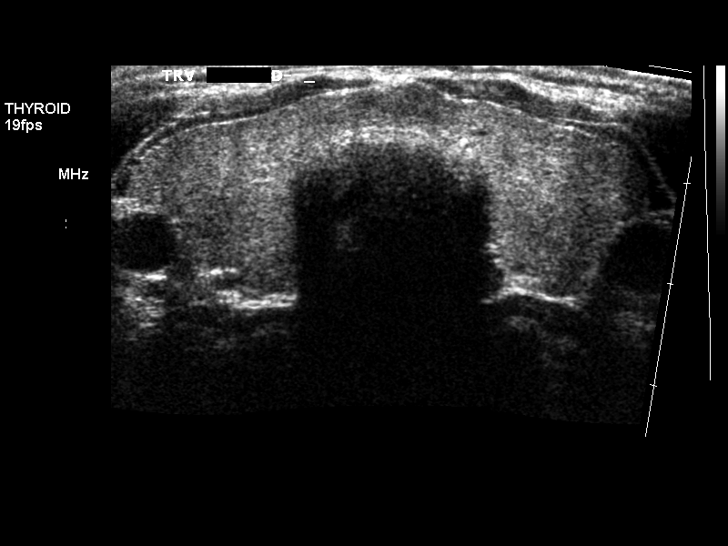
[im 4/45]
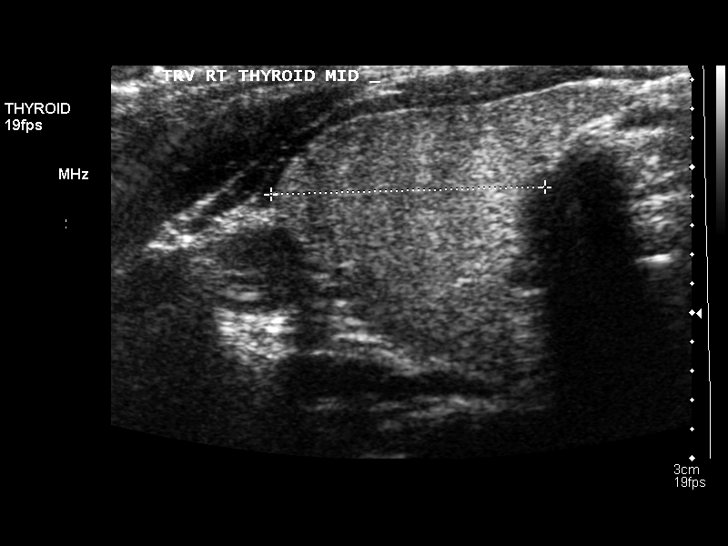
[im 8/45]
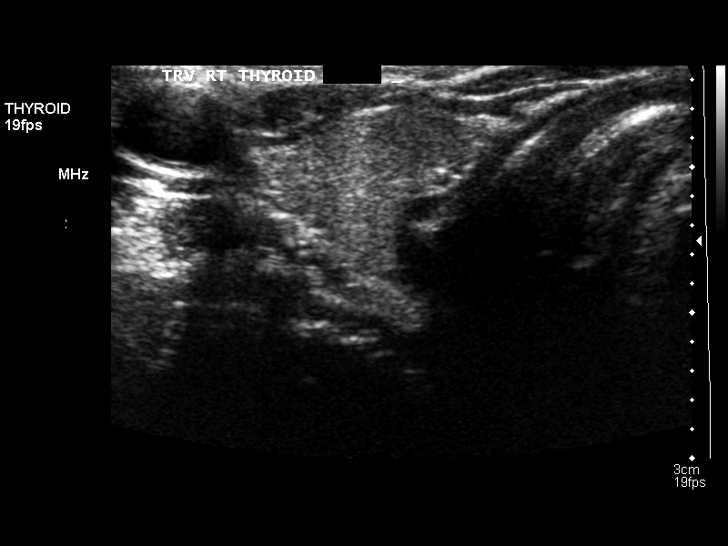
[im 12/45]
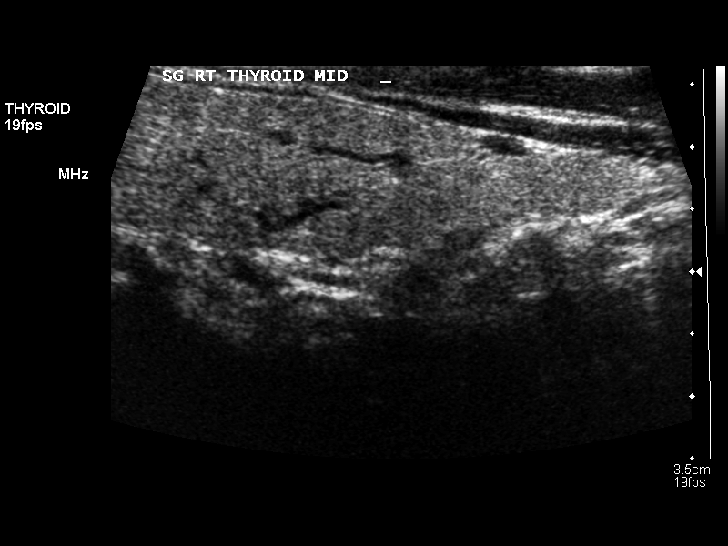
[im 15/45]
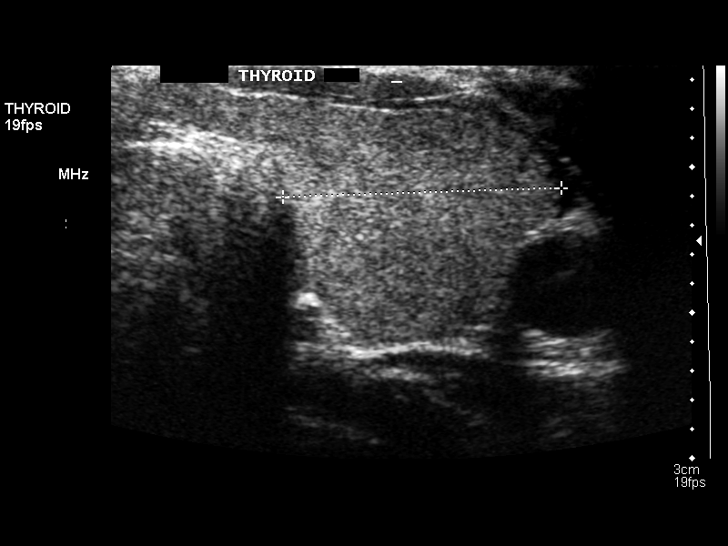
[im 17/45]
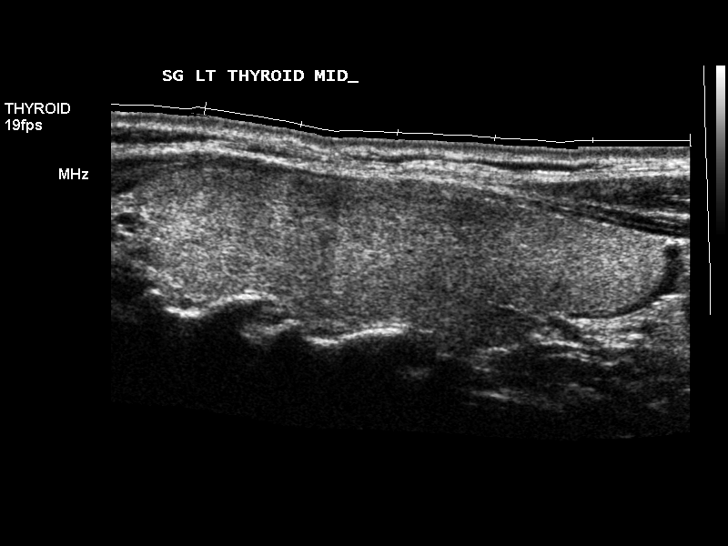
[im 21/45]
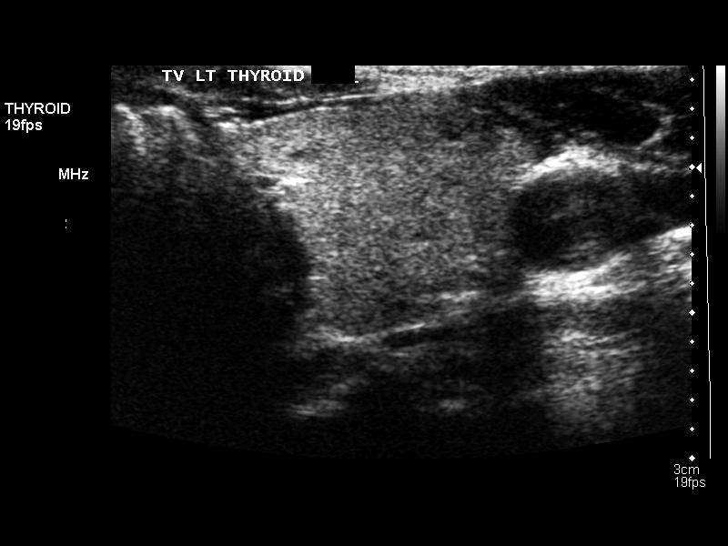
[im 24/45]
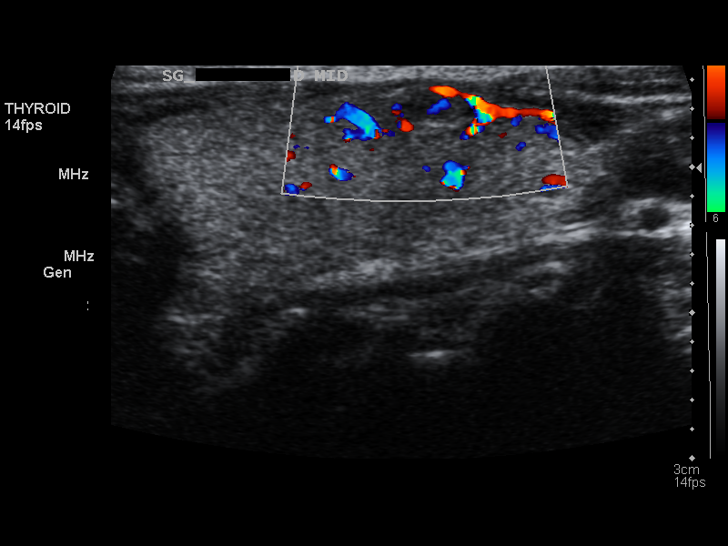
[im 28/45]
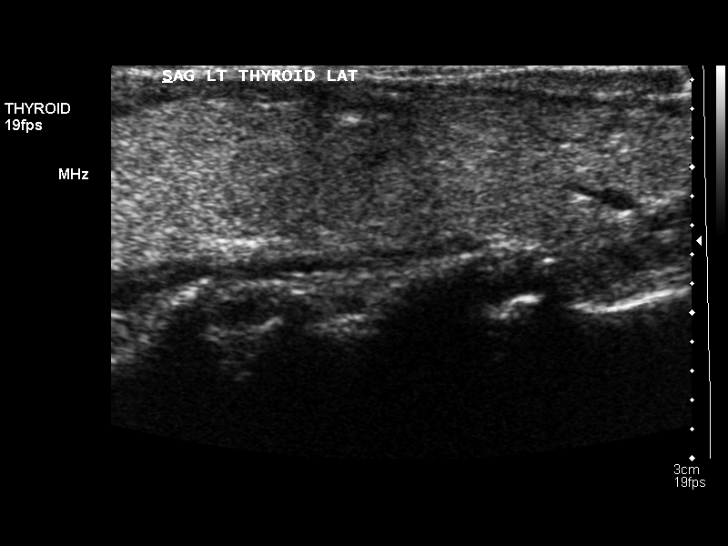
[im 30/45]
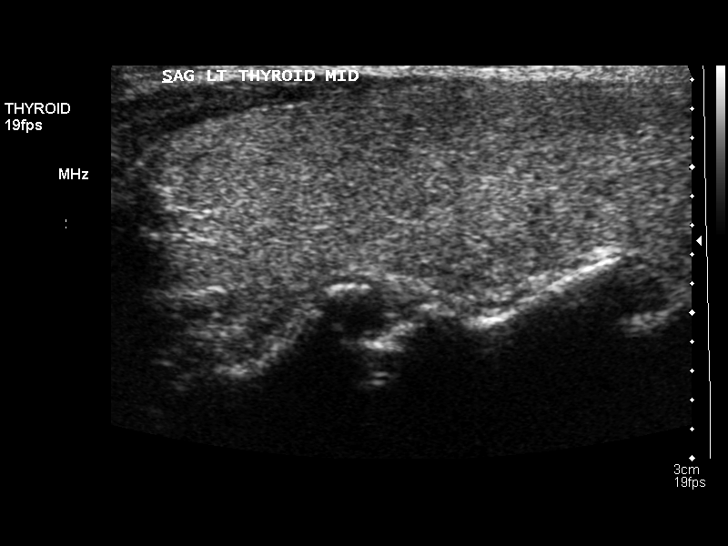
[im 34/45]
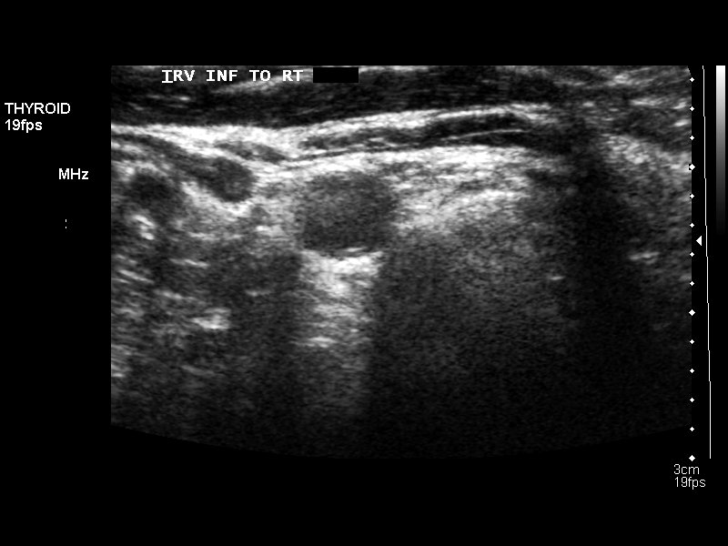
[im 37/45]
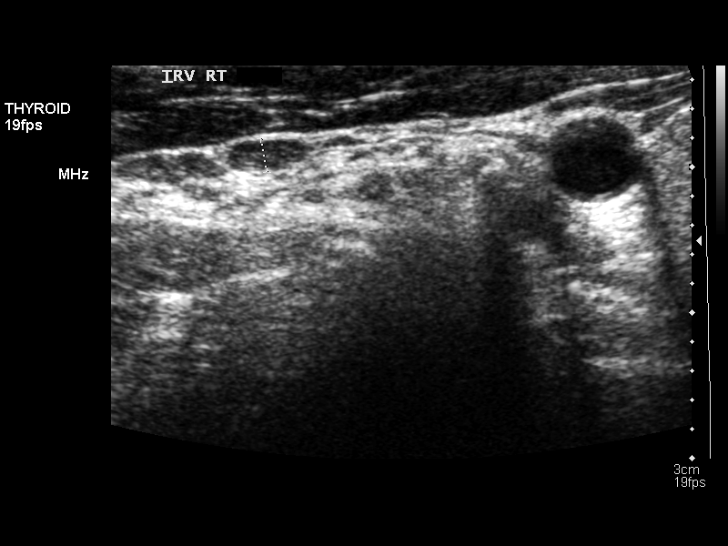
[im 41/45]
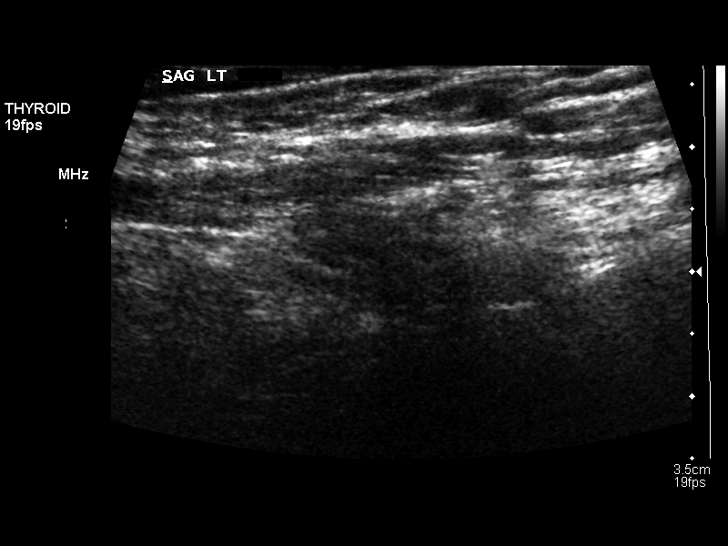
[im 45/45]
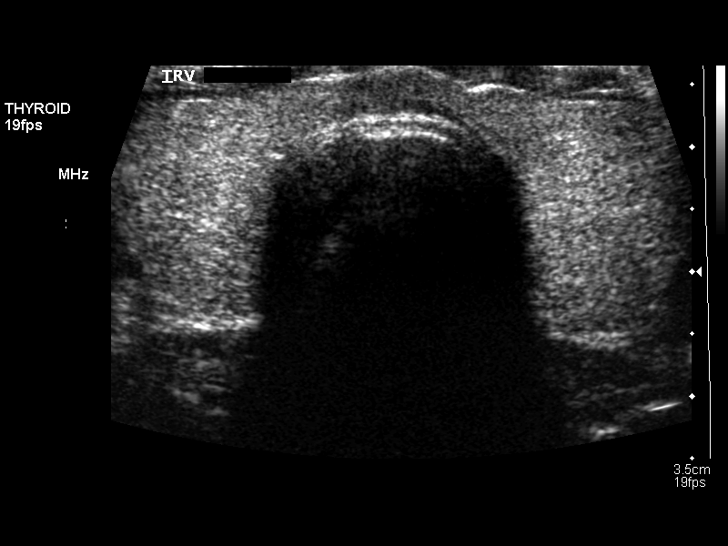

[14 of 25 positions shown; findings below may reference images not displayed]

FINDINGS: Right thyroid lobe

Measurements: 6.6 x 1.6 x 1.9 cm (previously 7.2 x 1.9 x 2.1 cm). No
nodules visualized.

Left thyroid lobe

Measurements: 5.6 x 1.6 x 1.9 cm (previously 5.9 x 1.9 x 1.7 cm).
Stable 7 mm hypoechoic solid nodule in the lateral aspect of the mid
gland.

Isthmus

Thickness: 0.4 cm.  No nodules visualized.

Lymphadenopathy

None visualized.
IMPRESSION: 1. Similar to slightly smaller overall size of the thyroid gland.
2. No interval change in the size or appearance of the 7 mm nodule
in the lateral left gland. 2.5 years of stability is highly
suggestive of a benign process.

## 2014-09-10 ENCOUNTER — Ambulatory Visit (INDEPENDENT_AMBULATORY_CARE_PROVIDER_SITE_OTHER): Payer: BC Managed Care – PPO | Admitting: Podiatry

## 2014-09-10 ENCOUNTER — Encounter: Payer: Self-pay | Admitting: Podiatry

## 2014-09-10 VITALS — BP 137/79 | HR 58 | Resp 18

## 2014-09-10 DIAGNOSIS — L608 Other nail disorders: Secondary | ICD-10-CM

## 2014-09-10 DIAGNOSIS — B353 Tinea pedis: Secondary | ICD-10-CM

## 2014-09-10 MED ORDER — CLOTRIMAZOLE-BETAMETHASONE 1-0.05 % EX CREA: 1.0000 "application " | TOPICAL_CREAM | Freq: Two times a day (BID) | CUTANEOUS | Status: AC

## 2014-09-10 NOTE — Patient Instructions (Signed)

## 2014-09-10 NOTE — Progress Notes (Signed)
   Subjective:    Patient ID: Kayla Sanford, female    DOB: 11-16-1951, 63 y.o.   MRN: [id number]  HPI  Kayla Sanford presents the office today with complaints of bilateral nail fungus on all the nails. She states that she has had discoloration and thickening to her nails for multiple years. She previously had seen a podiatrist in [place] for which she was given some compound cream to apply to the nails without any resolution. After that ime the patient was seen by podiatrist in [facility] for which she had 2 laser treatments. Also states that this time she did not have any resolution. States that at first the nails were cultured and she was told she had nail fungus. She does not want oral therapy. She is inquiring about other treatments and possible removal of all of her toenails permanently. Also states that she has some peeling on the bottom of her feet bilaterally and at times itches. No other complaints at this time.    Review of Systems  Gastrointestinal:       IBS   Musculoskeletal:       JOINT PAIN  Allergic/Immunologic: Positive for food allergies.       Objective:   Physical Exam AAO x3, NAD DP/PT pulses palpable 2/4 b/l. CRT < 3sec Protective sensation intact the Semmes Weinstein monofilament, vibratory sensation intact, Achilles tendon reflex intact. Nails hypertrophic, dystrophic, brittle, discoloration, brittle x10. No surrounding erythema or drainage from around the nails. Dry scaly skin on the plantar aspect of the feet bilaterally currently without any erythematous changes. No open lesions. MMT 5/5, ROM WNL      Assessment & Plan:  63 year old female with nail dystrophy versus onychomycosis on all nails -Conservative versus surgical treatment were discussed including alternatives, risks, complications. -Discussed that as she has had prior treatment for this we should proceed with a biopsy of the nail to see if there is fungus or not. The nails were debrided and  sent for culture to [facility] labs. -Discussed with her since her last therapy are to new topical medications that have been approved for nail fungus. Also discussed with her the option permanent nail removal as she was inquiring about this. -We will await the results of the nail biopsy before any decision is made. -For the dry scaly skin on the plantar aspect of the feet discussed with her given history of nail fungus and history of periodic itching to the area this is likely tinea pedis. Recommended Lotrisone cream for which a prescription was sent to her pharmacy. -Followup of the biopsy are results are obtained or sooner any problems are to arise. Call with any questions, concerns, change in symptoms.

## 2014-09-13 NOTE — Addendum Note (Signed)
Addended by: Enedina Finner on: 09/13/2014 12:19 PM   Modules accepted: Orders

## 2014-09-29 ENCOUNTER — Other Ambulatory Visit: Payer: Self-pay | Admitting: Obstetrics

## 2014-09-29 DIAGNOSIS — N6320 Unspecified lump in the left breast, unspecified quadrant: Secondary | ICD-10-CM

## 2014-09-29 DIAGNOSIS — N644 Mastodynia: Secondary | ICD-10-CM

## 2014-10-01 ENCOUNTER — Ambulatory Visit
Admission: RE | Admit: 2014-10-01 | Discharge: 2014-10-01 | Disposition: A | Discharge status: Home / Self Care | Payer: BC Managed Care – PPO | Source: Ambulatory Visit | Attending: Obstetrics | Admitting: Obstetrics

## 2014-10-01 DIAGNOSIS — N644 Mastodynia: Secondary | ICD-10-CM

## 2014-10-01 DIAGNOSIS — N6320 Unspecified lump in the left breast, unspecified quadrant: Secondary | ICD-10-CM

## 2014-10-04 ENCOUNTER — Telehealth: Payer: Self-pay | Admitting: *Deleted

## 2014-10-04 NOTE — Telephone Encounter (Signed)
I saw Dr. Ardelle AntonWagoner on September 11th.  He sent out some toenail clippings for analysis and I haven't heard anything back yet. So I'm calling to see if you got them back.  So I can find out what my follow-up treatment will be.  Thank you very much.

## 2014-10-05 NOTE — Telephone Encounter (Signed)
I called and informed her that we have not received the results yet.  We will call once we get the results and the doctor has reviewed it.  She stated, "I thought he told me it would just be for a few days."  I told her maybe it wasn't show anything so they have to let it grow some.  She stated, "Oh okay, so I guess you will call me when it comes in."  I told her yes, I will call.

## 2014-10-06 ENCOUNTER — Encounter: Payer: Self-pay | Admitting: Podiatry

## 2014-10-22 ENCOUNTER — Ambulatory Visit (INDEPENDENT_AMBULATORY_CARE_PROVIDER_SITE_OTHER): Payer: BC Managed Care – PPO | Admitting: Podiatry

## 2014-10-22 ENCOUNTER — Encounter: Payer: Self-pay | Admitting: Podiatry

## 2014-10-22 VITALS — BP 118/72 | HR 66 | Resp 16

## 2014-10-22 DIAGNOSIS — B351 Tinea unguium: Secondary | ICD-10-CM

## 2014-10-22 DIAGNOSIS — B353 Tinea pedis: Secondary | ICD-10-CM

## 2014-10-22 MED ORDER — TAVABOROLE 5 % EX SOLN: 1.0000 [drp] | CUTANEOUS | Status: AC

## 2014-10-25 NOTE — Progress Notes (Signed)
Patient ID: Kayla Sanford, female   DOB: 12/06/1951, 63 y.o.   MRN: [id number]  Subjective: Patient returns after followed by a patient of bilateral nail fungus and to discuss results of her biopsy. He has been using the Lotrisone cream and reports improved symptoms since starting this. She denies any acute changes since last appointment. No other complaints at this time.  Objective: AAO x3, NAD DP/PT pulses palpable bilaterally, CRT less than 3 seconds Protective sensation intact with Simms Weinstein monofilament, vibratory sensation intact, Achilles tendon reflex intact Nails hypertrophic, dystrophic, brittle, yellow brown discoloration 10. No ascending erythema or drainage. No tenderness strictly over the nail sides. There are areas of dry, peeling skin on the plantar aspect of feet bilaterally without any erythematous changes. No open lesions. MMT 5/5, ROM WNL  Assessment: 63 year old female follow-up evaluation for onychomycosis; tinea pedis.   Plan: -Treatment options discussing the alternatives, risks, complications. -Nail biopsy results were reviewed patient which revealed onychomycosis (T. Rubrum).  -Patient previously was thinking about having all having permanent nail avulsions on all nails. She was doing this more for cosmetic reasons it appears. I discussed with her After the nails removed she would likely develop a callus or other tissue over the area and they would not be needed. Smooth skin. She was unaware of this and she does not want to have the nail procedures done at this time. Discussed other alternatives for treatment. At this time the patient [facility]. Side effects of medication discussed the patient and directed to stop immediately if any are to occur and call the office. -Continue lotrisone.  -Follow-up as needed. In the meantime call the office if any questions, concerns, change in  symptoms. She states she likely follow-up in 6 months [place]

## 2015-01-05 ENCOUNTER — Other Ambulatory Visit: Payer: Self-pay

## 2015-01-05 DIAGNOSIS — Z1231 Encounter for screening mammogram for malignant neoplasm of breast: Secondary | ICD-10-CM

## 2015-03-15 ENCOUNTER — Telehealth: Payer: Self-pay | Admitting: Internal Medicine

## 2015-03-15 DIAGNOSIS — I10 Essential (primary) hypertension: Secondary | ICD-10-CM

## 2015-03-15 MED ORDER — METOPROLOL SUCCINATE ER 50 MG PO TB24: ORAL_TABLET | ORAL | Status: AC

## 2015-03-15 NOTE — Telephone Encounter (Signed)
Pt called in requesting to see if Dr hopper would extend her script for Metoprolol one more mont.  She is moving in April and she needs time to find a new [place] too?

## 2015-03-15 NOTE — Telephone Encounter (Signed)
Phone call to patient to see what pharmacy Metoprolol should be sent to. She requests [facility] [address]

## 2015-03-28 ENCOUNTER — Ambulatory Visit
Admission: RE | Admit: 2015-03-28 | Discharge: 2015-03-28 | Disposition: A | Discharge status: Home / Self Care | Payer: BC Managed Care – PPO | Source: Ambulatory Visit

## 2015-03-28 DIAGNOSIS — Z1231 Encounter for screening mammogram for malignant neoplasm of breast: Secondary | ICD-10-CM

## 2015-07-29 ENCOUNTER — Telehealth: Payer: Self-pay | Admitting: *Deleted

## 2015-07-29 NOTE — Telephone Encounter (Signed)
"  Want to confirm receipt of a criteria form that was sent over for [facility] for this patient on July 5th.  Please give me a call back.  Our hours of business is Monday thru Friday from 8am - 8pm."

## 2015-08-19 ENCOUNTER — Telehealth: Payer: Self-pay | Admitting: *Deleted

## 2015-08-19 NOTE — Telephone Encounter (Signed)
[facility] states criteria forms were sent to our office for Kerydin. I called [phone], they requested copy of 08/2014 fungal culture, asked if pt had tried any oral anti-fungal and if she had any contradictions to taking oral medications or any other symptoms, I explained pt was on multiple oral medications and was over 64 years old, and had IBS and symptoms of athletes foot.  Faxed fungal culture results to [phone].

## 2015-08-22 ENCOUNTER — Telehealth: Payer: Self-pay | Admitting: *Deleted

## 2015-08-22 NOTE — Telephone Encounter (Signed)
Faxed request for Kerydin to [employer], dx code B35.1, quantity 1 bottle for refills prn 365 days, signed by Dr. Ardelle Anton.  Faxed to [phone].

## 2015-08-26 ENCOUNTER — Telehealth: Payer: Self-pay | Admitting: *Deleted

## 2015-08-26 MED ORDER — CICLOPIROX 8 % EX SOLN: Freq: Every day | CUTANEOUS | Status: AC

## 2015-08-26 NOTE — Telephone Encounter (Signed)
We can try Penalc since she has [employer].

## 2015-08-26 NOTE — Telephone Encounter (Addendum)
[employer] denied Kerydin for pt - case# [id number].  I will advise Dr. Ardelle Anton.  Dr. Ardelle Anton ordered Penlac with 11 refills, and I informed pt.

## 2015-10-29 ENCOUNTER — Other Ambulatory Visit: Payer: Self-pay | Admitting: Internal Medicine

## 2015-10-31 ENCOUNTER — Other Ambulatory Visit: Payer: Self-pay | Admitting: Emergency Medicine

## 2015-10-31 NOTE — Telephone Encounter (Signed)
Spoke with pt. She is no longer seeing Dr Alwyn RenHopper. She has moved out of town.
